# Patient Record
Sex: Male | Born: 1965
Health system: Southern US, Community
[De-identification: ages and names within clinical notes are randomized; demographics above are authoritative.]

## PROBLEM LIST (undated history)

## (undated) DIAGNOSIS — I10 Essential (primary) hypertension: Secondary | ICD-10-CM

## (undated) DIAGNOSIS — C801 Malignant (primary) neoplasm, unspecified: Secondary | ICD-10-CM

---

## 2019-10-28 ENCOUNTER — Telehealth: Payer: Self-pay | Admitting: Physician Assistant

## 2019-10-28 ENCOUNTER — Ambulatory Visit
Admission: RE | Admit: 2019-10-28 | Discharge: 2019-10-28 | Disposition: A | Discharge status: Home / Self Care | Payer: Self-pay | Source: Ambulatory Visit | Attending: Hematology | Admitting: Hematology

## 2019-10-28 ENCOUNTER — Other Ambulatory Visit: Payer: Self-pay

## 2019-10-28 DIAGNOSIS — R160 Hepatomegaly, not elsewhere classified: Secondary | ICD-10-CM

## 2019-10-28 NOTE — Telephone Encounter (Signed)
Received an urgent referral from Dr. Shelia Media from [facility] for colon cancer w/lver mets. Craig Flowers has been scheduled to [facility] and Dr. [name] on 5/21 at 11am. I cld and provided the appt date and time to the pt's sister. Aware to arrive 15 minutes early.

## 2019-10-28 NOTE — Progress Notes (Signed)
Requested CT scan of abdomen from [facility] be pushed to [employer].

## 2019-10-29 ENCOUNTER — Other Ambulatory Visit: Payer: Self-pay

## 2019-10-29 ENCOUNTER — Other Ambulatory Visit: Payer: Self-pay | Admitting: Physician Assistant

## 2019-10-29 ENCOUNTER — Telehealth: Payer: Self-pay | Admitting: General Practice

## 2019-10-29 ENCOUNTER — Inpatient Hospital Stay: Payer: Medicaid Other | Attending: Physician Assistant | Admitting: Physician Assistant

## 2019-10-29 VITALS — BP 95/64 | HR 98 | Temp 97.5°F | Resp 18 | Wt 211.8 lb

## 2019-10-29 DIAGNOSIS — C786 Secondary malignant neoplasm of retroperitoneum and peritoneum: Secondary | ICD-10-CM | POA: Insufficient documentation

## 2019-10-29 DIAGNOSIS — R5383 Other fatigue: Secondary | ICD-10-CM | POA: Diagnosis not present

## 2019-10-29 DIAGNOSIS — R911 Solitary pulmonary nodule: Secondary | ICD-10-CM | POA: Insufficient documentation

## 2019-10-29 DIAGNOSIS — R160 Hepatomegaly, not elsewhere classified: Secondary | ICD-10-CM

## 2019-10-29 DIAGNOSIS — Z79899 Other long term (current) drug therapy: Secondary | ICD-10-CM | POA: Insufficient documentation

## 2019-10-29 DIAGNOSIS — R1031 Right lower quadrant pain: Secondary | ICD-10-CM | POA: Diagnosis not present

## 2019-10-29 DIAGNOSIS — Z7189 Other specified counseling: Secondary | ICD-10-CM | POA: Insufficient documentation

## 2019-10-29 DIAGNOSIS — C787 Secondary malignant neoplasm of liver and intrahepatic bile duct: Secondary | ICD-10-CM | POA: Insufficient documentation

## 2019-10-29 DIAGNOSIS — R1011 Right upper quadrant pain: Secondary | ICD-10-CM | POA: Diagnosis not present

## 2019-10-29 DIAGNOSIS — R110 Nausea: Secondary | ICD-10-CM | POA: Diagnosis not present

## 2019-10-29 DIAGNOSIS — C19 Malignant neoplasm of rectosigmoid junction: Secondary | ICD-10-CM | POA: Insufficient documentation

## 2019-10-29 NOTE — Progress Notes (Signed)
[place] Telephone:([phone]   Fax:([phone]) [phone]  CONSULT NOTE  REFERRING PHYSICIAN: Dr. Shelia Media  REASON FOR CONSULTATION:  Suspicious metastatic colorectal cancer  HPI Craig Flowers is a 54 y.o. male with a past medical history significant for hepatitis C, migraines, hypertension, and osteoarthritis is referred to the clinic for evaluation of suspicious metastatic colorectal cancer.  The patient is accompanied by his sister today.  The patient was referred to the clinic for evaluation after presenting to his primary care provider's office on 10/27/2019 for the chief complaint of 2 months of gradually worsening right sided abdominal pain.  He also saw his primary care provider for worsening cough a few days prior as well who discontinued his lisinopril and started him on losartan.  He had a chest x-ray which was negative for any acute cardiopulmonary disease.  The patient had lab work on 10/27/19 day which demonstrated a CMP with an alkaline phosphatase elevated at 250, AST slightly elevated at 48, and an ALT within normal limits at 33.  The rest of the patient's CMP was unremarkable.  The patient also had GGT testing which was elevated at 130.  The patient's CBC demonstrated mild normocytic anemia with a hemoglobin of 12.9 with the rest of the CBC unremarkable.  The patient also a UA that day which was unremarkable. The patient had a CT scan of the abdomen and pelvis performed that same day which noted mass lesion associated with the right hemicolon with numerous hepatic metastatic lesions and extensive peritoneal and mesenteric metastasis.  There is also a 8 mm left lower lobe pulmonary nodule concerning for a potential metastatic lesion.  The scan also noted bilateral nephrolithiasis without evidence of destruction.  The patient was then referred to our clinic for further evaluation and recommendations regarding his current condition and further work-up.  The patient has never  had a colonoscopy.  He did see Dr. Therisa Doyne in 2019 for treatment of his hepatitis C which he states he no longer has evidence of.  He was scheduled to have a routine colonoscopy next month with Dr. Cristina Gong at gastroenterology.   Today, the patient's main symptom is related to the right-sided abdominal pain which encompasses the right upper quadrant and right lower quadrant.  The patient motions that this pain wraps around to his right lumbar region.  He describes the sensation as a "spasm" or a "sharp pain".  The duration and frequency of the pain varies but it occurs "on and off all day long".  The patient believes that taking a hot shower as well as using mineral ice helps alleviate his pain.  His pain is aggravated by sitting.  He was prescribed tramadol for his pain which helps decrease his pain from a 7- 8 out of 10 to a 4-5 out of 10.  [name] prescribed to take that is much as 6 times a day but he reports that he has been taking it 2-3 times per day.  He denies any recent fever, chills, or night sweats.  He reports approximately a 25 pound weight loss since January 2021.  He reports a decreased appetite.  Denies any significant nausea or vomiting except for vomiting with coughing spells.  He reports that he has had a couple occasions of "not black but really dark stool".  Denies hematochezia, jaundice, abdominal distention, or itching.  He denies any significant diarrhea or constipation but does report that his stools are " long and thin".  His last bowel  movement was yesterday. He denies any palpable lymphadenopathy.  Patient is coughing frequently and clearing his throat but denies any hemoptysis or chest pain.  The patient's family history significant for father who also had colorectal cancer at the age of 63.  His father also has arthritis as well as gout.  The patient's mother has passed away from mesothelioma but she also had migraines, thyroid dysfunction, rosacea, and hypertension.  The patient sister  has osteoporosis and has had multiple fractures and joint replacements.  The patient denies any other known family history of malignancy.  The patient is currently not working and previously worked several "odd jobs".  He lives on 15 acres of property and home by himself; however his sister and father live in the house next door which is also located on this property.  [name] divorced and is currently single.  He does not have any children.  He drinks anywhere between 0-3 drinks per week.  He used to smoke approximately 1 pack of cigarettes over the span of a weekend for about 5 or 6 years but does not smoke at this time.  He does chew tobacco though.  He denies any history of drug use.    Social History Social History   Tobacco Use  . Smoking status: Not on file  Substance Use Topics  . Alcohol use: Not on file  . Drug use: Not on file    No Known Allergies  Current Outpatient Medications  Medication Sig Dispense Refill  . acetaminophen (TYLENOL) 325 MG tablet Take 650 mg by mouth every 6 (six) hours as needed (Take with tramadol.).    Marland Kitchen losartan (COZAAR) 100 MG tablet Take 100 mg by mouth daily.    . Multiple Vitamin (MULTIVITAMIN WITH MINERALS) TABS tablet Take 1 tablet by mouth daily.    . traMADol (ULTRAM) 50 MG tablet Take 50 mg by mouth every 6 (six) hours as needed.    . vitamin C (ASCORBIC ACID) 250 MG tablet Take 500 mg by mouth daily.     No current facility-administered medications for this visit.    Review of Systems REVIEW OF SYSTEMS:   Review of Systems  Constitutional: Positive for appetite change, fatigue, and weight loss.  Negative for fever. HENT: Negative for mouth sores, nosebleeds, sore throat and trouble swallowing.   Eyes: Negative for eye problems and icterus.  Respiratory: Positive for dry cough.  Negative for hemoptysis, shortness of breath and wheezing.   Cardiovascular: Negative for chest pain and leg swelling.  Gastrointestinal: Positive for  right-sided abdominal pain, melena?,  And changes in stool caliber.  Negative for constipation, diarrhea, nausea and vomiting.  Genitourinary: Negative for bladder incontinence, difficulty urinating, dysuria, frequency and hematuria.   Musculoskeletal: Negative for back pain, gait problem, neck pain and neck stiffness.  Skin: Negative for itching and rash.  Neurological: Negative for dizziness, extremity weakness, gait problem, headaches, light-headedness and seizures.  Hematological: Negative for adenopathy. Does not bruise/bleed easily.  Psychiatric/Behavioral: Negative for confusion, depression and sleep disturbance. The patient is not nervous/anxious.     PHYSICAL EXAMINATION:  Blood pressure 95/64, pulse 98, temperature (!) 97.5 F (36.4 C), temperature source Temporal, resp. rate 18, weight 211 lb 12.8 oz (96.1 kg), SpO2 99 %.  ECOG PERFORMANCE STATUS: 1  Physical Exam  Constitutional: Oriented to person, place, and time and well-developed, well-nourished, and in no distress.  HENT:  Head: Normocephalic and atraumatic.  Mouth/Throat: Oropharynx is clear and moist. No oropharyngeal exudate.  Eyes:  Conjunctivae are normal. Right eye exhibits no discharge. Left eye exhibits no discharge. No scleral icterus.  Neck: Normal range of motion. Neck supple.  Cardiovascular: Normal rate, regular rhythm, normal heart sounds and intact distal pulses.   Pulmonary/Chest: Effort normal and breath sounds normal. No respiratory distress. No wheezes. No rales.  Abdominal: Soft. Hepatomegaly. Bowel sounds are normal. Exhibits no distension and no mass. Very mild tenderness over the RLQ.  Musculoskeletal: Normal range of motion. Exhibits no edema.  Lymphadenopathy:    No cervical adenopathy.  Neurological: Alert and oriented to person, place, and time. Exhibits normal muscle tone. Gait normal. Coordination normal.  Skin: Skin is warm and dry. No rash noted. Not diaphoretic. No erythema. No pallor.    Psychiatric: Mood, memory and judgment normal.  Vitals reviewed.  LABORATORY DATA: No results found for: WBC, HGB, HCT, MCV, PLT    Chemistry   No results found for: NA, K, CL, CO2, BUN, CREATININE, GLU No results found for: CALCIUM, ALKPHOS, AST, ALT, BILITOT     RADIOGRAPHIC STUDIES: No results found.  ASSESSMENT:  This is a very pleasant 54 year old male with suspicious metastatic colorectal cancer.  1.  Suspicious stage IV colorectal cancer with liver metastases, peritoneal and mesenteric metastasis. -He was diagnosed in May 2021 after presenting to his primary care provider with a chief complaint of abdominal pain.  His baseline LFTs demonstrated a GGT slightly elevated at 130, alk phos elevated at 250, AST slightly elevated at 48, and AST within normal limits at 33.  Patient has CT scan of the abdomen which demonstrated mass lesion associated with the right hemicolon with numerous hepatic metastatic lesions and extensive peritoneal and mesenteric metastasis.  There is also a 8 mm left lower lobe pulmonary nodule concerning for potential metastatic lesion.   -The patient was seen by Dr. [name] today who had a lengthy discussion with the patient about his current condition and treatment options.  Dr. [name] discussed that this is advanced stage disease and it is not curable but is treatable. -Dr. [name] recommended completing the staging work-up and confirming the diagnosis with tissue diagnosis. -I will arrange for the patient to have a staging CT scan of the chest to further assess for pulmonary metastasis. The CT scan of the abdomen demonstrated a possible 8 mm left lower lobe pulmonary nodule suspicious for metastasis.  -I will arrange for the patient to have an ultrasound-guided biopsy of the liver lesions for tissue diagnosis as well as to be sent for molecular studies with MMR and foundation 1 testing  -I will arrange for follow-up visit with Dr. [name] once I have the results of the  studies so she can have a more detailed discussion with the patient about the recommended treatment and potential side effects.   2. Abdominal pain secondary to #1 -Patient currently prescribed Tramadol for his pain by his PCP  3. Uninsured -Once definitive diagnosis, social work will reach out to the patient to help the patient apply to assistive programs if he qualifies.  -Social work is already aware of the patient.   PLAN: -Liver biopsy of the most accessbile metastatic lesion for tissue diagnosis and molecular studies -CT scan of the chest to complete the staging work up.  -Will arrange follow up with Dr. [name] once I have the results from these studies. I will likely arrange for CEA labs to be done the day of his next follow up visit.   Maxie Slovacek L Leslee Haueter PA-C   Addendum  I  have seen the patient, examined him. I agree with the assessment and and plan and have edited the notes.   Mr. Hightower is a 54 yo male with PMH of hypertension and untreated hepatitis C, presented with worsening right-sided abdominal pain.  His CT AP scan is highly concerning for metastatic colon cancer to liver.  The primary tumor is in the cecum, no radiographic or clinical concern for bowel obstruction, or severe bleeding.  I recommend ultrasound-guided liver biopsy for diagnosis and obtaining tumor tissue for molecular testing.  We discussed the overall prognosis of metastatic colon cancer, and treatment options.  We will get a CT chest to complete staging, plan to see him back in 1 to 2 weeks after this biopsy results return. All questions were answered.    Truitt Merle  10/29/2019

## 2019-10-29 NOTE — Telephone Encounter (Signed)
[place] Initial Psychosocial Assessment Clinical Social Work  Clinical Social Work contacted by phone to assess psychosocial, emotional, mental health, and spiritual needs of the patient.   Barriers to care/review of distress screen:  - Transportation:  Do you anticipate any problems getting to appointments?  Do you have someone who can help run errands for you if you need it?  Sister or father will help w transportation to appointments.  Can afford gas.  Will ask for [facility] if needed.   - Help at home:  What is your living situation (alone, family, other)?  If you are physically unable to care for yourself, who would you call on to help you?  Sister lives on same property and is helping him w all needs.  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  Who would you call on if you needed some kind of practical help?  What if you needed someone to talk to for emotional support?  Mainly family.  Sister is a widow, husband died couple of years ago - she moved her father and brother onto her property for additional support.  Says re patient  "he is my rock."   - Finances:  Are you concerned about finances.  Considering returning to work?  If not, applying for disability?  Was working odd jobs, fairly physical jobs like painting, trim work, Ecologist.  "Had a lot of little jobs to do but I just cant do them."  Advised that when scan/biopsy is complete, he can be assisted to apply for disability and Medicaid.  No income at this point, uninsured also.   What is your understanding of where you are with your cancer? Its cause?  Your treatment plan and what happens next?  Newly diagnosed w colon cancer, is in process of complete work up.  Diagnosis was unexpected.  Patient is unable to work - tried to work, but had to leave most recent job due to severe fatigue.  Sister  is very close to patient - this is very hard on her as well as on the patient.  Patient has an accepting attitude - "will get through this one day at a time."  Sister and family rely on their faith.  CSW Summary:  Patient and family psychosocial functioning including strengths, limitations, and coping skills: 54 yo single male, newly diagnosed with colon cancer, in process of complete work up and development of treatment plan.  Lives on sister's property - she is very supportive, and family is very close.  He has no insurance at this time, nor does he have any income.  He has not had steady income for most of his life - has done odd jobs to support himself.    Identifications of barriers to care:  Lack of insurance and no income.  Availability of community resources:  [employer] for [employer], can be assessed for [employer] by [employer] (message sent).    Clinical Social Worker follow up needed: Yes.    Follow up call in two weeks.  Edwyna Shell, LCSW Clinical Social Worker Phone:  [phone] Cell:  [phone]

## 2019-10-29 NOTE — Patient Instructions (Signed)
-  We will arrange for a CT scan of the chest. Be on the lookout for a phone call from them. If you do not hear anything, their number is [phone]. This is the radiology  -I will arrange for you to have a ultrasound and biopsy of the liver to get a sample of the tissue in the liver. Be on the look out for a phone call from their office. This is the interventional radiology department.

## 2019-10-29 NOTE — Progress Notes (Signed)
Met with patient and his sister Terri at his initial medical oncology appointment today.  I explained by role as nurse navigator and they were given my direct contact number and encouraged to call with any questions or concerns.  They both verbalized an understanding.

## 2019-11-01 ENCOUNTER — Encounter (HOSPITAL_COMMUNITY): Payer: Self-pay

## 2019-11-01 ENCOUNTER — Emergency Department (HOSPITAL_COMMUNITY)
Admission: EM | Admit: 2019-11-01 | Discharge: 2019-11-01 | Disposition: A | Discharge status: Home / Self Care | Payer: Medicaid Other | Attending: Emergency Medicine | Admitting: Emergency Medicine

## 2019-11-01 ENCOUNTER — Other Ambulatory Visit: Payer: Self-pay

## 2019-11-01 ENCOUNTER — Emergency Department (HOSPITAL_COMMUNITY): Payer: Medicaid Other

## 2019-11-01 DIAGNOSIS — Z85038 Personal history of other malignant neoplasm of large intestine: Secondary | ICD-10-CM | POA: Diagnosis not present

## 2019-11-01 DIAGNOSIS — I10 Essential (primary) hypertension: Secondary | ICD-10-CM | POA: Insufficient documentation

## 2019-11-01 DIAGNOSIS — Z79899 Other long term (current) drug therapy: Secondary | ICD-10-CM | POA: Diagnosis not present

## 2019-11-01 DIAGNOSIS — R1031 Right lower quadrant pain: Secondary | ICD-10-CM | POA: Insufficient documentation

## 2019-11-01 DIAGNOSIS — F17220 Nicotine dependence, chewing tobacco, uncomplicated: Secondary | ICD-10-CM | POA: Diagnosis not present

## 2019-11-01 DIAGNOSIS — C787 Secondary malignant neoplasm of liver and intrahepatic bile duct: Secondary | ICD-10-CM | POA: Insufficient documentation

## 2019-11-01 DIAGNOSIS — R109 Unspecified abdominal pain: Secondary | ICD-10-CM

## 2019-11-01 HISTORY — DX: Malignant (primary) neoplasm, unspecified: C80.1

## 2019-11-01 HISTORY — DX: Essential (primary) hypertension: I10

## 2019-11-01 LAB — CBC WITH DIFFERENTIAL/PLATELET
Abs Immature Granulocytes: 0.17 10*3/uL — ABNORMAL HIGH (ref 0.00–0.07)
Basophils Absolute: 0 10*3/uL (ref 0.0–0.1)
Basophils Relative: 0 %
Eosinophils Absolute: 0.2 10*3/uL (ref 0.0–0.5)
Eosinophils Relative: 2 %
HCT: 39.8 % (ref 39.0–52.0)
Hemoglobin: 12.5 g/dL — ABNORMAL LOW (ref 13.0–17.0)
Immature Granulocytes: 2 %
Lymphocytes Relative: 9 %
Lymphs Abs: 0.9 10*3/uL (ref 0.7–4.0)
MCH: 26.9 pg (ref 26.0–34.0)
MCHC: 31.4 g/dL (ref 30.0–36.0)
MCV: 85.8 fL (ref 80.0–100.0)
Monocytes Absolute: 0.7 10*3/uL (ref 0.1–1.0)
Monocytes Relative: 8 %
Neutro Abs: 7.7 10*3/uL (ref 1.7–7.7)
Neutrophils Relative %: 79 %
Platelets: 450 10*3/uL — ABNORMAL HIGH (ref 150–400)
RBC: 4.64 MIL/uL (ref 4.22–5.81)
RDW: 14.1 % (ref 11.5–15.5)
WBC: 9.7 10*3/uL (ref 4.0–10.5)
nRBC: 0 % (ref 0.0–0.2)

## 2019-11-01 LAB — COMPREHENSIVE METABOLIC PANEL
ALT: 45 U/L — ABNORMAL HIGH (ref 0–44)
AST: 95 U/L — ABNORMAL HIGH (ref 15–41)
Albumin: 2.7 g/dL — ABNORMAL LOW (ref 3.5–5.0)
Alkaline Phosphatase: 269 U/L — ABNORMAL HIGH (ref 38–126)
Anion gap: 12 (ref 5–15)
BUN: 19 mg/dL (ref 6–20)
CO2: 25 mmol/L (ref 22–32)
Calcium: 9.1 mg/dL (ref 8.9–10.3)
Chloride: 100 mmol/L (ref 98–111)
Creatinine, Ser: 1.15 mg/dL (ref 0.61–1.24)
GFR calc Af Amer: >60 mL/min (ref >60)
GFR calc non Af Amer: >60 mL/min (ref >60)
Glucose, Bld: 136 mg/dL — ABNORMAL HIGH (ref 70–99)
Potassium: 4.1 mmol/L (ref 3.5–5.1)
Sodium: 137 mmol/L (ref 135–145)
Total Bilirubin: 1.3 mg/dL — ABNORMAL HIGH (ref 0.3–1.2)
Total Protein: 8 g/dL (ref 6.5–8.1)

## 2019-11-01 LAB — URINALYSIS, ROUTINE W REFLEX MICROSCOPIC
Bilirubin Urine: NEGATIVE
Glucose, UA: NEGATIVE mg/dL
Hgb urine dipstick: NEGATIVE
Ketones, ur: NEGATIVE mg/dL
Leukocytes,Ua: NEGATIVE
Nitrite: NEGATIVE
Protein, ur: NEGATIVE mg/dL
Specific Gravity, Urine: 1.011 (ref 1.005–1.030)
pH: 6 (ref 5.0–8.0)

## 2019-11-01 LAB — LIPASE, BLOOD: Lipase: 38 U/L (ref 11–51)

## 2019-11-01 MED ORDER — MORPHINE SULFATE (PF) 4 MG/ML IV SOLN
6.0000 mg | Freq: Once | INTRAVENOUS | Status: AC
  Administered 2019-11-01: 6 mg via INTRAVENOUS
  Filled 2019-11-01: qty 2

## 2019-11-01 MED ORDER — ONDANSETRON 8 MG PO TBDP
8.0000 mg | ORAL_TABLET | Freq: Three times a day (TID) | ORAL | 0 refills | Status: DC | PRN
Start: 2019-11-01 — End: 2019-11-12

## 2019-11-01 MED ORDER — METOCLOPRAMIDE HCL 5 MG/ML IJ SOLN
5.0000 mg | Freq: Once | INTRAMUSCULAR | Status: AC
  Administered 2019-11-01: 5 mg via INTRAVENOUS
  Filled 2019-11-01: qty 2

## 2019-11-01 MED ORDER — SODIUM CHLORIDE 0.9 % IV BOLUS
1000.0000 mL | Freq: Once | INTRAVENOUS | Status: AC
  Administered 2019-11-01: 1000 mL via INTRAVENOUS

## 2019-11-01 MED ORDER — SODIUM CHLORIDE 0.9 % IV SOLN: INTRAVENOUS | Status: DC

## 2019-11-01 MED ORDER — OXYCODONE-ACETAMINOPHEN 5-325 MG PO TABS: 1.0000 | ORAL_TABLET | ORAL | 0 refills | Status: DC | PRN

## 2019-11-01 NOTE — ED Triage Notes (Signed)
Patient reports that he saw his oncologist 3 days ago. Patient states that she prescribed him tramadol. Patient states the Tramadol makes him nauseated and does not help the pain at all. Patient states he called his oncologist and states that he was told to come to the ED for pain control and fluids. Patient states when he has a BM the pain intensifies.

## 2019-11-01 NOTE — ED Provider Notes (Addendum)
[facility] DEPT Provider Note   CSN: [id number] Arrival date & time: 11/01/19  1213     History Chief Complaint  Patient presents with  . cancer patient  . Abdominal Pain    Craig Flowers is a 54 y.o. male.  54 year old male presents with worsening right lower quadrant abdominal discomfort which is now moved to his suprapubic region.  Patient diagnosed with right-sided colon cancer last week.  Had been scheduled to have further testing done for that.  Saw oncology last week and that work-up is progressing.  States that he had a few bouts of emesis this week but none currently.  Denies any fever or chills.  States he is passing flatus.  States the pain comes in waves after he eats or moves his bowels.  He denies any urinary symptoms.  Called his doctor and told to come here.  No treatment for this use prior to arrival        Past Medical History:  Diagnosis Date  . Cancer ([facility] [place])   . Hypertension     Patient Active Problem List   Diagnosis Date Noted  . Metastatic carcinoma to liver ([place]) 10/29/2019  . Goals of care, counseling/discussion 10/29/2019    History reviewed. No pertinent surgical history.     History reviewed. No pertinent family history.  Social History   Tobacco Use  . Smoking status: Never Smoker  . Smokeless tobacco: Current User    Types: Snuff  Substance Use Topics  . Alcohol use: Yes  . Drug use: Never    Home Medications Prior to Admission medications   Medication Sig Start Date End Date Taking? Authorizing Provider  acetaminophen (TYLENOL) 325 MG tablet Take 650 mg by mouth every 6 (six) hours as needed (Take with tramadol.).    [provider]  losartan (COZAAR) 100 MG tablet Take 100 mg by mouth daily.    [provider]  Multiple Vitamin (MULTIVITAMIN WITH MINERALS) TABS tablet Take 1 tablet by mouth daily.    [provider]  traMADol (ULTRAM) 50 MG tablet Take 50 mg by mouth every  6 (six) hours as needed.    [provider]  vitamin C (ASCORBIC ACID) 250 MG tablet Take 500 mg by mouth daily.    [provider]    Allergies    Patient has no known allergies.  Review of Systems   Review of Systems  All other systems reviewed and are negative.   Physical Exam Updated Vital Signs BP 132/71 (BP Location: Right Arm)   Pulse (!) 101   Temp 97.9 F (36.6 C) (Oral)   Resp 18   Ht 1.854 m (6\' 1" )   Wt 95.7 kg   SpO2 97%   BMI 27.84 kg/m   Physical Exam Vitals and nursing note reviewed.  Constitutional:      General: [name] not in acute distress.    Appearance: Normal appearance. [name] well-developed. [name] not toxic-appearing.  HENT:     Head: Normocephalic and atraumatic.  Eyes:     General: Lids are normal.     Conjunctiva/sclera: Conjunctivae normal.     Pupils: Pupils are equal, round, and reactive to light.  Neck:     Thyroid: No thyroid mass.     Trachea: No tracheal deviation.  Cardiovascular:     Rate and Rhythm: Normal rate and regular rhythm.     Heart sounds: Normal heart sounds. No murmur. No gallop.   Pulmonary:  Effort: Pulmonary effort is normal. No respiratory distress.     Breath sounds: Normal breath sounds. No stridor. No decreased breath sounds, wheezing, rhonchi or rales.  Abdominal:     General: Bowel sounds are normal. There is no distension.     Palpations: Abdomen is soft.     Tenderness: There is abdominal tenderness in the right lower quadrant and suprapubic area. There is no guarding or rebound.    Musculoskeletal:        General: No tenderness. Normal range of motion.     Cervical back: Normal range of motion and neck supple.  Skin:    General: Skin is warm and dry.     Findings: No abrasion or rash.  Neurological:     Mental Status: [name] alert and oriented to person, place, and time.     GCS: GCS eye subscore is 4. GCS verbal subscore is 5. GCS motor subscore is 6.     Cranial Nerves: No  cranial nerve deficit.     Sensory: No sensory deficit.  Psychiatric:        Speech: Speech normal.        Behavior: Behavior normal.     ED Results / Procedures / Treatments   Labs (all labs ordered are listed, but only abnormal results are displayed) Labs Reviewed  CBC WITH DIFFERENTIAL/PLATELET  COMPREHENSIVE METABOLIC PANEL  URINALYSIS, ROUTINE W REFLEX MICROSCOPIC  LIPASE, BLOOD    EKG None  Radiology No results found.  Procedures Procedures (including critical care time)  Medications Ordered in ED Medications  sodium chloride 0.9 % bolus 1,000 mL (has no administration in time range)  0.9 %  sodium chloride infusion (has no administration in time range)  metoCLOPramide (REGLAN) injection 5 mg (has no administration in time range)  morphine 4 MG/ML injection 6 mg (has no administration in time range)    ED Course  I have reviewed the triage vital signs and the nursing notes.  Pertinent labs & imaging results that were available during my care of the patient were reviewed by me and considered in my medical decision making (see chart for details).    MDM Rules/Calculators/A&P                      Patient given IV fluids here along with opiate medications and feels much better.  Labs are reviewed here and around his baseline.  Cute abdominal series shows no signs obstruction.  Case discussed with patient as well as his sister.  Patient is reviewed with going home and follow-up tomorrow in the clinic return precautions given.  Will prescribe opiates as well as antiemetics. Final Clinical Impression(s) / ED Diagnoses Final diagnoses:  None    Rx / DC Orders ED Discharge Orders    None       Lacretia Leigh, MD 11/01/19 1626    Lacretia Leigh, MD 11/01/19 1627

## 2019-11-01 NOTE — Progress Notes (Signed)
Patient's sister calls stating patient has definitely declined over the weekend.  Increased abdominal pain and pain after defecation.  Nausea, some episodes of diarrhea.  Coughing up thick mucus.  He was prescribed Tramadol which did not help.  Has not eaten much of anything all weekend.  I consulted with Dr. [name] per her instructions I advised her that patient needs to go to [facility] ED and be admitted to the hospital for dehydration and pain control.   She verbalized an understanding.

## 2019-11-01 NOTE — Progress Notes (Signed)
Craig Flowers Male, 54 y.o., 04-20-1966 MRN:  [id number] Phone:  [phone] Viona Gilmore) PCP:  Deland Pretty, MD Primary Cvg:  None Next Appt With Radiology ([facility]-CT 2) 11/04/2019 at 2:30 PM  RE: Biopsy Received: 2 days ago Message Contents  Craig Daft, MD  Ernestene Mention for US guided liver lesion biopsy.   [place]   Previous Messages  ----- Message -----  From: [name]  Sent: 10/29/2019  5:37 PM EDT  To: [facility] Procedure Requests  Subject: Biopsy                      Procedure Requested:US Core Biopsy (Liver    Reason for Procedure: PLease take multiple core samples to confirm diagnosis and for molecular testing for suspicious metastatic colorectal cancer    Provider Requesting: Heilingoetter, Cassandra Carlean Jews  Provider Telephone: [phone]    Other Info:

## 2019-11-02 ENCOUNTER — Other Ambulatory Visit: Payer: Self-pay

## 2019-11-02 ENCOUNTER — Encounter: Payer: Self-pay | Admitting: Hematology

## 2019-11-02 ENCOUNTER — Inpatient Hospital Stay (HOSPITAL_BASED_OUTPATIENT_CLINIC_OR_DEPARTMENT_OTHER): Payer: Medicaid Other | Admitting: Hematology

## 2019-11-02 VITALS — BP 118/73 | HR 96 | Temp 97.8°F | Resp 17 | Ht 73.0 in | Wt 211.9 lb

## 2019-11-02 DIAGNOSIS — C787 Secondary malignant neoplasm of liver and intrahepatic bile duct: Secondary | ICD-10-CM

## 2019-11-02 DIAGNOSIS — C19 Malignant neoplasm of rectosigmoid junction: Secondary | ICD-10-CM | POA: Diagnosis not present

## 2019-11-02 NOTE — Progress Notes (Signed)
[place]   [phone] [phone]   Clinic Follow up Note   Patient Care Team: Deland Pretty, MD as PCP - General (Internal Medicine) Jonnie Finner, RN as Oncology Nurse Navigator Truitt Merle, MD as Consulting Physician (Hematology) Heilingoetter, Tobe Sos, PA-C as Physician Assistant (Physician Assistant)  Date of Service:  11/02/2019  CHIEF COMPLAINT: Suspicious metastatic colorectal cancer, Abdominal pain    INTERVAL HISTORY:  Craig Flowers is here for a follow up. He presents to the clinic with his father. He went to ED yesterday and feels much better today. He notes in the past 4-5 days he had no appetite and had significant pain. He tried tramadol but that made him sicker and her stopped it. He was bed bound for 3-4 days. He notes his ED visit came him fluids and morphine drip and antiemetics which helped a lot. When he went home he was able to eat very well, no pain and able to sleep. He notes he had a good BM this morning. He notes the BM in the past week were small, clumpy stool with pencil shape. He notes only 1 episode of diarrhea. He was discharged with Percocet and took half tablet 1-2 times.    REVIEW OF SYSTEMS:   Constitutional: Denies fevers, chills or abnormal weight loss Eyes: Denies blurriness of vision Ears, nose, mouth, throat, and face: Denies mucositis or sore throat Respiratory: Denies cough, dyspnea or wheezes Cardiovascular: Denies palpitation, chest discomfort or lower extremity swelling Gastrointestinal:  Denies nausea, heartburn  (+) Improved low stool output (+) Abdomina cramps Skin: Denies abnormal skin rashes Lymphatics: Denies new lymphadenopathy or easy bruising Neurological:Denies numbness, tingling or new weaknesses Behavioral/Psych: Mood is stable, no new changes  All other systems were reviewed with the patient and are negative.  MEDICAL HISTORY:  Past Medical History:  Diagnosis Date  . Cancer ([place])    . Hypertension     SURGICAL HISTORY: History reviewed. No pertinent surgical history.  I have reviewed the social history and family history with the patient and they are unchanged from previous note.  ALLERGIES:  has No Known Allergies.  MEDICATIONS:  Current Outpatient Medications  Medication Sig Dispense Refill  . acetaminophen (TYLENOL) 325 MG tablet Take 650 mg by mouth every 6 (six) hours as needed (Take with tramadol.).    Marland Kitchen losartan (COZAAR) 100 MG tablet Take 100 mg by mouth daily.    . Multiple Vitamin (MULTIVITAMIN WITH MINERALS) TABS tablet Take 1 tablet by mouth daily.    . ondansetron (ZOFRAN ODT) 8 MG disintegrating tablet Take 1 tablet (8 mg total) by mouth every 8 (eight) hours as needed for nausea or vomiting. 20 tablet 0  . oxyCODONE-acetaminophen (PERCOCET/ROXICET) 5-325 MG tablet Take 1-2 tablets by mouth every 4 (four) hours as needed for severe pain. 15 tablet 0  . traMADol (ULTRAM) 50 MG tablet Take 50 mg by mouth every 6 (six) hours as needed.    . vitamin C (ASCORBIC ACID) 250 MG tablet Take 500 mg by mouth daily.     No current facility-administered medications for this visit.    PHYSICAL EXAMINATION: ECOG PERFORMANCE STATUS: 2 - Symptomatic, <50% confined to bed  Vitals:   11/02/19 1029  BP: 118/73  Pulse: 96  Resp: 17  Temp: 97.8 F (36.6 C)  SpO2: 99%   Filed Weights   11/02/19 1029  Weight: 211 lb 14.4 oz (96.1 kg)    Due to COVID19 we will limit examination to appearance.  Patient had no complaints.  GENERAL:alert, no distress and comfortable SKIN: skin color normal, no rashes or significant lesions EYES: normal, Conjunctiva are pink and non-injected, sclera clear  NEURO: alert & oriented x 3 with fluent speech   LABORATORY DATA:  I have reviewed the data as listed CBC Latest Ref Rng & Units 11/01/2019  WBC 4.0 - 10.5 K/uL 9.7  Hemoglobin 13.0 - 17.0 g/dL 12.5(L)  Hematocrit 39.0 - 52.0 % 39.8  Platelets 150 - 400 K/uL 450(H)      CMP Latest Ref Rng & Units 11/01/2019  Glucose 70 - 99 mg/dL 136(H)  BUN 6 - 20 mg/dL 19  Creatinine 0.61 - 1.24 mg/dL 1.15  Sodium 135 - 145 mmol/L 137  Potassium 3.5 - 5.1 mmol/L 4.1  Chloride 98 - 111 mmol/L 100  CO2 22 - 32 mmol/L 25  Calcium 8.9 - 10.3 mg/dL 9.1  Total Protein 6.5 - 8.1 g/dL 8.0  Total Bilirubin 0.3 - 1.2 mg/dL 1.3(H)  Alkaline Phos 38 - 126 U/L 269(H)  AST 15 - 41 U/L 95(H)  ALT 0 - 44 U/L 45(H)      RADIOGRAPHIC STUDIES: I have personally reviewed the radiological images as listed and agreed with the findings in the report. DG ABD ACUTE 2+V W 1V CHEST  Result Date: 11/01/2019 CLINICAL DATA:  Abdominal pain, diarrhea, right lower rib pain EXAM: DG ABDOMEN ACUTE W/ 1V CHEST COMPARISON:  None. FINDINGS: There is no evidence of dilated bowel loops or free intraperitoneal air. Enteric contrast and stool balls in the distal colon and rectum, in keeping with recent prior CT. No radiopaque calculi or other significant radiographic abnormality is seen. Heart size and mediastinal contours are within normal limits. Both lungs are clear. IMPRESSION: 1.  No acute abnormality of the lungs. 2. Nonobstructive pattern of bowel gas. Enteric contrast and stool balls in the distal colon and rectum, in keeping with recent prior CT. Electronically Signed   By: Eddie Candle M.D.   On: 11/01/2019 13:37     ASSESSMENT & PLAN:  Craig Flowers is a 54 y.o. male with   1. Abdominal pain, Nausea and Fatigue  -Due to worsened abdominal pain, low stool output and nausea, he was bed ridden for 3-4 days. He then went to ED on 5/24.   -He did not tolerate Tramadol as it exacerbated his nausea. When he went to ED he was given IV Morphine which helped. He was discharged with percocet 5-322m which is working well. I will refill when he is out of it.  -Today he is doing much better, eating well, sleeping well and had adequate BM this morning. -Now his main pain is abdominal cramping. He has  taken half tablet twice which also helped -I discussed with stronger pain medication this can lead to constipation. I advised him to use prophylactic Miralax to avoid constipation given he already has partial bowel obstruction.  -I discussed with pain control his symptoms she improve.    2.  Suspicious stage IV colorectal cancer with liver metastases, peritoneal and mesenteric metastasis. -He was diagnosed in May 2021 after presenting to his primary care provider with a chief complaint of abdominal pain. His baseline LFTs demonstrated a GGT slightly elevated at 130, alk phos elevated at 250, AST slightly elevated at 48, and AST within normal limits at 33.  His CT AP from 10/28/19 showed mass lesion associated with the right hemicolon with numerous hepatic metastatic lesions and extensive peritoneal and mesenteric metastasis. There is  also a 8 mm left lower lobe pulmonary nodule concerning for potential metastatic lesion.   -I discussed this is appears to be advanced stage disease and it is not curable but is treatable. -I recommended completing the staging work-up and confirming the diagnosis with tissue diagnosis. He will have CT chest on 11/04/19 and Liver biopsy on 11/10/19.  -f/u on 6/4 after biopsy  3. Financial Assistance  -[name] uninsured. Once definitive diagnosis, social work will reach out to the patient to help the patient apply to assistive programs if he qualifies.  -Social work is already aware of the patient.    PLAN: -CT chest on 5/27 scheduled -Liver biopsy on 6/2 scheduled  -Lab and f/u  On 6/4     No problem-specific Assessment & Plan notes found for this encounter.   No orders of the defined types were placed in this encounter.  All questions were answered. The patient knows to call the clinic with any problems, questions or concerns. No barriers to learning was detected. The total time spent in the appointment was 25 minutes.     Truitt Merle, MD 11/02/2019   I, Joslyn Devon, am acting as scribe for Truitt Merle, MD.   I have reviewed the above documentation for accuracy and completeness, and I agree with the above.

## 2019-11-02 NOTE — Progress Notes (Signed)
Patient's sister Karna Christmas calls upset that he is being discharged from the ED despite feeling much better with IVF and pain medication.  I consulted with Dr. [name] and offered patient appointment to come in the morning for evaluation.  He was given an appointment of 1040.  The sister is very emotional over her brother's diagnosis.

## 2019-11-03 ENCOUNTER — Telehealth: Payer: Self-pay | Admitting: Hematology

## 2019-11-03 ENCOUNTER — Encounter: Payer: Self-pay | Admitting: Physician Assistant

## 2019-11-03 NOTE — Telephone Encounter (Signed)
Scheduled appt per 5/25 los.  Spoke with pt and they are aware of their appt date and time 

## 2019-11-04 ENCOUNTER — Ambulatory Visit (HOSPITAL_COMMUNITY)
Admission: RE | Admit: 2019-11-04 | Discharge: 2019-11-04 | Disposition: A | Discharge status: Home / Self Care | Payer: Medicaid Other | Source: Ambulatory Visit | Attending: Physician Assistant | Admitting: Physician Assistant

## 2019-11-04 ENCOUNTER — Other Ambulatory Visit: Payer: Self-pay

## 2019-11-04 DIAGNOSIS — R160 Hepatomegaly, not elsewhere classified: Secondary | ICD-10-CM

## 2019-11-04 MED ORDER — IOHEXOL 300 MG/ML  SOLN
75.0000 mL | Freq: Once | INTRAMUSCULAR | Status: AC | PRN
  Administered 2019-11-04: 75 mL via INTRAVENOUS

## 2019-11-04 MED ORDER — SODIUM CHLORIDE (PF) 0.9 % IJ SOLN
INTRAMUSCULAR | Status: AC
  Filled 2019-11-04: qty 50

## 2019-11-06 ENCOUNTER — Other Ambulatory Visit: Payer: Self-pay | Admitting: Oncology

## 2019-11-06 MED ORDER — OXYCODONE-ACETAMINOPHEN 5-325 MG PO TABS: 1.0000 | ORAL_TABLET | ORAL | 0 refills | Status: DC | PRN

## 2019-11-10 ENCOUNTER — Encounter (HOSPITAL_COMMUNITY): Payer: Self-pay

## 2019-11-10 ENCOUNTER — Other Ambulatory Visit: Payer: Self-pay

## 2019-11-10 ENCOUNTER — Ambulatory Visit (HOSPITAL_COMMUNITY)
Admission: RE | Admit: 2019-11-10 | Discharge: 2019-11-10 | Disposition: A | Discharge status: Home / Self Care | Payer: Medicaid Other | Source: Ambulatory Visit | Attending: Physician Assistant | Admitting: Physician Assistant

## 2019-11-10 DIAGNOSIS — Z79899 Other long term (current) drug therapy: Secondary | ICD-10-CM | POA: Diagnosis not present

## 2019-11-10 DIAGNOSIS — Z79891 Long term (current) use of opiate analgesic: Secondary | ICD-10-CM | POA: Diagnosis not present

## 2019-11-10 DIAGNOSIS — I10 Essential (primary) hypertension: Secondary | ICD-10-CM | POA: Insufficient documentation

## 2019-11-10 DIAGNOSIS — C787 Secondary malignant neoplasm of liver and intrahepatic bile duct: Secondary | ICD-10-CM | POA: Diagnosis not present

## 2019-11-10 DIAGNOSIS — R160 Hepatomegaly, not elsewhere classified: Secondary | ICD-10-CM

## 2019-11-10 DIAGNOSIS — C189 Malignant neoplasm of colon, unspecified: Secondary | ICD-10-CM | POA: Diagnosis present

## 2019-11-10 DIAGNOSIS — J90 Pleural effusion, not elsewhere classified: Secondary | ICD-10-CM | POA: Insufficient documentation

## 2019-11-10 LAB — CBC
HCT: 40.3 % (ref 39.0–52.0)
Hemoglobin: 12.2 g/dL — ABNORMAL LOW (ref 13.0–17.0)
MCH: 26 pg (ref 26.0–34.0)
MCHC: 30.3 g/dL (ref 30.0–36.0)
MCV: 85.7 fL (ref 80.0–100.0)
Platelets: 656 10*3/uL — ABNORMAL HIGH (ref 150–400)
RBC: 4.7 MIL/uL (ref 4.22–5.81)
RDW: 14.5 % (ref 11.5–15.5)
WBC: 16.5 10*3/uL — ABNORMAL HIGH (ref 4.0–10.5)
nRBC: 0 % (ref 0.0–0.2)

## 2019-11-10 LAB — PROTIME-INR
INR: 1.1 (ref 0.8–1.2)
Prothrombin Time: 13.6 seconds (ref 11.4–15.2)

## 2019-11-10 MED ORDER — GELATIN ABSORBABLE 12-7 MM EX MISC
CUTANEOUS | Status: AC
  Filled 2019-11-10: qty 1

## 2019-11-10 MED ORDER — FENTANYL CITRATE (PF) 100 MCG/2ML IJ SOLN
INTRAMUSCULAR | Status: AC | PRN
  Administered 2019-11-10: 50 ug via INTRAVENOUS

## 2019-11-10 MED ORDER — PROMETHAZINE HCL 25 MG/ML IJ SOLN
12.5000 mg | Freq: Four times a day (QID) | INTRAMUSCULAR | Status: DC | PRN
  Administered 2019-11-10: 12.5 mg via INTRAVENOUS
  Filled 2019-11-10 (×3): qty 1

## 2019-11-10 MED ORDER — LIDOCAINE HCL (PF) 1 % IJ SOLN
INTRAMUSCULAR | Status: AC
  Filled 2019-11-10: qty 30

## 2019-11-10 MED ORDER — FENTANYL CITRATE (PF) 100 MCG/2ML IJ SOLN
INTRAMUSCULAR | Status: AC
  Filled 2019-11-10: qty 2

## 2019-11-10 MED ORDER — HYDROCODONE-ACETAMINOPHEN 5-325 MG PO TABS
1.0000 | ORAL_TABLET | ORAL | Status: DC | PRN
  Administered 2019-11-10: 1 via ORAL
  Filled 2019-11-10: qty 1

## 2019-11-10 MED ORDER — MIDAZOLAM HCL 2 MG/2ML IJ SOLN
INTRAMUSCULAR | Status: AC
  Filled 2019-11-10: qty 2

## 2019-11-10 MED ORDER — MIDAZOLAM HCL 2 MG/2ML IJ SOLN
INTRAMUSCULAR | Status: AC | PRN
  Administered 2019-11-10: 1 mg via INTRAVENOUS

## 2019-11-10 NOTE — Procedures (Signed)
  Procedure: US core biopsy  liver lesion   EBL:   minimal Complications:  none immediate  See full dictation in [facility] PACS.  D. Bethanie Bloxom MD Main # [phone] Pager  [phone]    

## 2019-11-10 NOTE — H&P (Signed)
Chief Complaint: Patient was seen in consultation today for liver lesion  Referring Physician(s): Flowers,Craig L  Supervising Physician: Craig Flowers  Patient Status: Craig Flowers - Out-pt  History of Present Illness: Craig Flowers is a 54 y.o. male with past medical history of HTN who presented to his PCP with generalized malaise and abdominal pain 3 weeks ago.  An abdominal CT scan showed concern for metastatic colon cancer.  IR consulted for liver lesion biopsy at the request of Craig Flowers.   Patient presents to IR today in his usual state of health.  He has been NPO.  He reports nausea today which has been present for the past 7-10 days.  He does have anti-nausea medicine at home, however did not take since his was NPO today. [name] aware of procedure plans today and is agreeable to proceed.   Past Medical History:  Diagnosis Date  . Cancer ([place])   . Hypertension     History reviewed. No pertinent surgical history.  Allergies: Patient has no known allergies.  Medications: Prior to Admission medications   Medication Sig Start Date End Date Taking? Authorizing Provider  acetaminophen (TYLENOL) 325 MG tablet Take 650 mg by mouth every 6 (six) hours as needed (Take with tramadol.).    [provider]  losartan (COZAAR) 100 MG tablet Take 100 mg by mouth daily.    [provider]  Multiple Vitamin (MULTIVITAMIN WITH MINERALS) TABS tablet Take 1 tablet by mouth daily.    [provider]  ondansetron (ZOFRAN ODT) 8 MG disintegrating tablet Take 1 tablet (8 mg total) by mouth every 8 (eight) hours as needed for nausea or vomiting. 11/01/19   Craig Leigh, MD  oxyCODONE-acetaminophen (PERCOCET/ROXICET) 5-325 MG tablet Take 1-2 tablets by mouth every 4 (four) hours as needed for severe pain. 11/06/19   Craig Pier, MD  traMADol (ULTRAM) 50 MG tablet Take 50 mg by mouth every 6 (six) hours as needed.    [provider]  vitamin C (ASCORBIC  ACID) 250 MG tablet Take 500 mg by mouth daily.    [provider]     History reviewed. No pertinent family history.  Social History   Socioeconomic History  . Marital status: Single    Spouse name: Not on file  . Number of children: Not on file  . Years of education: Not on file  . Highest education level: Not on file  Occupational History  . Not on file  Tobacco Use  . Smoking status: Never Smoker  . Smokeless tobacco: Current User    Types: Snuff  Substance and Sexual Activity  . Alcohol use: Yes  . Drug use: Never  . Sexual activity: Not on file  Other Topics Concern  . Not on file  Social History Narrative  . Not on file   Social Determinants of Health   Financial Resource Strain:   . Difficulty of Paying Living Expenses:   Food Insecurity:   . Worried About Charity fundraiser in the Last Year:   . Arboriculturist in the Last Year:   Transportation Needs:   . Film/video editor (Medical):   Marland Kitchen Lack of Transportation (Non-Medical):   Physical Activity:   . Days of Exercise per Week:   . Minutes of Exercise per Session:   Stress:   . Feeling of Stress :   Social Connections:   . Frequency of Communication with Friends and Family:   . Frequency of Social Gatherings  with Friends and Family:   . Attends Religious Services:   . Active Member of Clubs or Organizations:   . Attends Archivist Meetings:   Marland Kitchen Marital Status:      Review of Systems: A 12 point ROS discussed and pertinent positives are indicated in the HPI above.  All other systems are negative.  Review of Systems  Constitutional: Negative for fatigue and fever.  Respiratory: Negative for cough and shortness of breath.   Cardiovascular: Negative for chest pain.  Gastrointestinal: Positive for abdominal pain and nausea. Negative for constipation, diarrhea and vomiting.  Musculoskeletal: Negative for back pain.  Psychiatric/Behavioral: Negative for behavioral problems and  confusion.    Vital Signs: BP 125/72   Pulse (!) 103   Temp (!) 97.5 F (36.4 C) (Skin)   Resp 16   Ht 6\' 1"  (1.854 m)   Wt 211 lb (95.7 kg)   SpO2 100%   BMI 27.84 kg/m   Physical Exam Vitals and nursing note reviewed.  Constitutional:      General: [name] not in acute distress.    Appearance: Normal appearance. [name] ill-appearing.  HENT:     Mouth/Throat:     Mouth: Mucous membranes are moist.     Pharynx: Oropharynx is clear.  Cardiovascular:     Rate and Rhythm: Normal rate and regular rhythm.  Pulmonary:     Effort: Pulmonary effort is normal. No respiratory distress.     Breath sounds: Normal breath sounds.  Skin:    General: Skin is warm and dry.  Neurological:     General: No focal deficit present.     Mental Status: [name] alert and oriented to person, place, and time. Mental status is at baseline.  Psychiatric:        Mood and Affect: Mood normal.        Behavior: Behavior normal.        Thought Content: Thought content normal.        Judgment: Judgment normal.      MD Evaluation Airway: WNL Heart: WNL Abdomen: WNL Chest/ Lungs: WNL ASA  Classification: 3 Mallampati/Airway Score: One   Imaging: CT Chest W Contrast  Result Date: 11/04/2019 CLINICAL DATA:  Colorectal cancer, new diagnosis, initial staging EXAM: CT CHEST WITH CONTRAST TECHNIQUE: Multidetector CT imaging of the chest was performed during intravenous contrast administration. CONTRAST:  51mL OMNIPAQUE IOHEXOL 300 MG/ML  SOLN COMPARISON:  None. FINDINGS: Cardiovascular: No significant vascular findings. Normal heart size. No pericardial effusion. Mediastinum/Nodes: No enlarged mediastinal, hilar, or axillary lymph nodes. Thyroid gland, trachea, and esophagus demonstrate no significant findings. Lungs/Pleura: There are multiple bilateral pulmonary nodules, an index nodule of the left lower lobe measuring 1.3 cm (series 5, image 78) and an index nodule of the right lower lobe measuring 1.2 cm  (series 5, image 81). And small right, trace left pleural effusions. Upper Abdomen: Numerous hypodense lesions of the liver. Trace ascites. Musculoskeletal: No chest wall mass or suspicious bone lesions identified. IMPRESSION: 1. Multiple bilateral pulmonary nodules, consistent with pulmonary metastatic disease. 2. Small right, trace left pleural effusions, presumed malignant. 3. Numerous hypodense lesions of the liver, consistent with hepatic metastatic disease. 4. Trace ascites. Electronically Signed   By: Eddie Candle M.D.   On: 11/04/2019 15:38   DG ABD ACUTE 2+V W 1V CHEST  Result Date: 11/01/2019 CLINICAL DATA:  Abdominal pain, diarrhea, right lower rib pain EXAM: DG ABDOMEN ACUTE W/ 1V CHEST COMPARISON:  None. FINDINGS: There is  no evidence of dilated bowel loops or free intraperitoneal air. Enteric contrast and stool balls in the distal colon and rectum, in keeping with recent prior CT. No radiopaque calculi or other significant radiographic abnormality is seen. Heart size and mediastinal contours are within normal limits. Both lungs are clear. IMPRESSION: 1.  No acute abnormality of the lungs. 2. Nonobstructive pattern of bowel gas. Enteric contrast and stool balls in the distal colon and rectum, in keeping with recent prior CT. Electronically Signed   By: Eddie Candle M.D.   On: 11/01/2019 13:37    Labs:  CBC: Recent Labs    11/01/19 1346  WBC 9.7  HGB 12.5*  HCT 39.8  PLT 450*    COAGS: No results for input(s): INR, APTT in the last 8760 hours.  BMP: Recent Labs    11/01/19 1346  NA 137  K 4.1  CL 100  CO2 25  GLUCOSE 136*  BUN 19  CALCIUM 9.1  CREATININE 1.15  GFRNONAA >60  GFRAA >60    LIVER FUNCTION TESTS: Recent Labs    11/01/19 1346  BILITOT 1.3*  AST 95*  ALT 45*  ALKPHOS 269*  PROT 8.0  ALBUMIN 2.7*    TUMOR MARKERS: No results for input(s): AFPTM, CEA, CA199, CHROMGRNA in the last 8760 hours.  Assessment and Plan: Patient with past medical  history of HTN presents with complaint of suspected metastatic disease, liver lesions.  IR consulted for liver lesion biopsy at the request of Craig Flowers. Case reviewed by Dr. Anselm Pancoast who approves patient for procedure.  Patient presents today in their usual state of health.  He has been NPO and is not currently on blood thinners.  Vital signs stable. Reports nausea.  Ordered 12.5mg  IV Phenergan.   Risks and benefits of biopsy was discussed with the patient and/or patient's family including, but not limited to bleeding, infection, damage to adjacent structures or low yield requiring additional tests.  All of the questions were answered and there is agreement to proceed.  Consent signed and in chart.  Thank you for this interesting consult.  I greatly enjoyed meeting Zakhai Jette and look forward to participating in their care.  A copy of this report was sent to the requesting provider on this date.  Electronically Signed: Docia Barrier, PA 11/10/2019, 12:14 PM   I spent a total of  30 Minutes   in face to face in clinical consultation, greater than 50% of which was counseling/coordinating care for liver lesion.

## 2019-11-10 NOTE — Discharge Instructions (Signed)

## 2019-11-11 ENCOUNTER — Other Ambulatory Visit: Payer: Self-pay

## 2019-11-11 DIAGNOSIS — C787 Secondary malignant neoplasm of liver and intrahepatic bile duct: Secondary | ICD-10-CM

## 2019-11-11 NOTE — Progress Notes (Signed)
Craig Flowers   [phone] Fax:([phone]) [phone]   Clinic Follow up Note   Patient Care Team: Deland Pretty, MD as PCP - General (Internal Medicine) Jonnie Finner, RN as Oncology Nurse Navigator Truitt Merle, MD as Consulting Physician (Hematology) Heilingoetter, Tobe Sos, PA-C as Physician Assistant (Physician Assistant)  Date of Service:  11/12/2019  CHIEF COMPLAINT: Metastatic colon cancer  SUMMARY OF ONCOLOGIC HISTORY: Oncology History Overview Note  Cancer Staging No matching staging information was found for the patient.    Metastatic colon cancer to liver ([place])  10/27/2019 Imaging   CT abdomen 10/27/19 at [facility] IMPRESSION: Mass lesion associated with the right hemicolon with numerous hepatic metastatic lesions and extensive peritoneal and mesenteric metastasis. Findings suspicious for primary colonic neoplasm with metastasis.  8 mm left lower lobe pulmonary nodule concerning for potential metastatic lesion.  Bilateral nephrolithiasis without evidence of obstruction.   11/04/2019 Imaging   CT Chest 11/04/19 IMPRESSION: 1. Multiple bilateral pulmonary nodules, consistent with pulmonary metastatic disease. 2. Small right, trace left pleural effusions, presumed malignant. 3. Numerous hypodense lesions of the liver, consistent with hepatic metastatic disease. 4. Trace ascites.   11/10/2019 Initial Biopsy   FINAL MICROSCOPIC DIAGNOSIS: 11/10/19  A. LIVER, RIGHT LOBE, NEEDLE CORE BIOPSY:  - Adenocarcinoma, consistent with colorectal type primary.  - See comment.  COMMENT:  Given the clinical findings, the histology is consistent with the above  diagnosis.  The case was discussed with Dr. [name] on 11/11/2019.  MMR and  Foundation One testing will be performed and the results reported  separately.    11/12/2019 Initial Diagnosis   Metastatic colon cancer to liver ([place])   11/18/2019 -  Chemotherapy   PENDING FOLFOX every 2 weeks starting 11/18/19        CURRENT THERAPY:  PENDING FOLFOX q2weeks starting 11/18/19    INTERVAL HISTORY:  Craig Flowers is here for a follow up. He presents to the clinic with his sister and father. He notes his liver biopsy went well but has pain with deep breath now. [name] taking 2 half tablets percocet a day. He notes the masks makes him cough and does not have a cough without the mask. He had diarrhea after liver biopsy but has resolved today. He denies LE edema. He plans to consult with Dr Marlou Starks on 11/22/19, but they are willing to cancel. His father had colon cancer in 2003 and treated by Dr Truddie Coco without recurrence.  He notes he drinks 3-4 12 ounce bottles of water.    REVIEW OF SYSTEMS:   Constitutional: Denies fevers, chills or abnormal weight loss Eyes: Denies blurriness of vision Ears, nose, mouth, throat, and face: Denies mucositis or sore throat Respiratory: Denies cough, dyspnea or wheezes Cardiovascular: Denies palpitation, chest discomfort or lower extremity swelling Gastrointestinal:  Denies nausea, heartburn or change in bowel habits Skin: Denies abnormal skin rashes Lymphatics: Denies new lymphadenopathy or easy bruising Neurological:Denies numbness, tingling or new weaknesses Behavioral/Psych: Mood is stable, no new changes  All other systems were reviewed with the patient and are negative.  MEDICAL HISTORY:  Past Medical History:  Diagnosis Date  . Cancer ([place])   . Hypertension     SURGICAL HISTORY: History reviewed. No pertinent surgical history.  I have reviewed the social history and family history with the patient and they are unchanged from previous note.  ALLERGIES:  has No Known Allergies.  MEDICATIONS:  Current Outpatient Medications  Medication Sig Dispense Refill  . acetaminophen (TYLENOL) 325 MG tablet  Take 650 mg by mouth every 6 (six) hours as needed (Take with tramadol.).    Marland Kitchen losartan (COZAAR) 100 MG tablet Take 100 mg by mouth daily.    . Multiple Vitamin  (MULTIVITAMIN WITH MINERALS) TABS tablet Take 1 tablet by mouth daily.    . ondansetron (ZOFRAN ODT) 8 MG disintegrating tablet Take 1 tablet (8 mg total) by mouth every 8 (eight) hours as needed for nausea or vomiting. 30 tablet 2  . oxyCODONE (OXY IR/ROXICODONE) 5 MG immediate release tablet Take 1 tablet (5 mg total) by mouth every 6 (six) hours as needed for severe pain. 50 tablet 0  . oxyCODONE-acetaminophen (PERCOCET/ROXICET) 5-325 MG tablet Take 1-2 tablets by mouth every 4 (four) hours as needed for severe pain. 15 tablet 0  . prochlorperazine (COMPAZINE) 10 MG tablet Take 1 tablet (10 mg total) by mouth every 6 (six) hours as needed for nausea or vomiting. 30 tablet 0  . traMADol (ULTRAM) 50 MG tablet Take 50 mg by mouth every 6 (six) hours as needed.    . vitamin C (ASCORBIC ACID) 250 MG tablet Take 500 mg by mouth daily.     No current facility-administered medications for this visit.    PHYSICAL EXAMINATION: ECOG PERFORMANCE STATUS: 2 - Symptomatic, <50% confined to bed  Vitals:   11/12/19 1454  BP: 125/70  Pulse: 94  Resp: 20  Temp: 97.7 F (36.5 C)  SpO2: 100%   Filed Weights   11/12/19 1454  Weight: 205 lb 6.4 oz (93.2 kg)    Due to COVID19 we will limit examination to appearance. Patient had no complaints.  GENERAL:alert, no distress and comfortable SKIN: skin color normal, no rashes or significant lesions EYES: normal, Conjunctiva are pink and non-injected, sclera clear  NEURO: alert & oriented x 3 with fluent speech   LABORATORY DATA:  I have reviewed the data as listed CBC Latest Ref Rng & Units 11/12/2019 11/10/2019 11/01/2019  WBC 4.0 - 10.5 K/uL 12.0(H) 16.5(H) 9.7  Hemoglobin 13.0 - 17.0 g/dL 11.4(L) 12.2(L) 12.5(L)  Hematocrit 39.0 - 52.0 % 37.0(L) 40.3 39.8  Platelets 150 - 400 K/uL 605(H) 656(H) 450(H)     CMP Latest Ref Rng & Units 11/12/2019 11/01/2019  Glucose 70 - 99 mg/dL 126(H) 136(H)  BUN 6 - 20 mg/dL 17 19  Creatinine 0.61 - 1.24 mg/dL 1.37(H)  1.15  Sodium 135 - 145 mmol/L 133(L) 137  Potassium 3.5 - 5.1 mmol/L 4.3 4.1  Chloride 98 - 111 mmol/L 98 100  CO2 22 - 32 mmol/L 23 25  Calcium 8.9 - 10.3 mg/dL 9.6 9.1  Total Protein 6.5 - 8.1 g/dL 7.6 8.0  Total Bilirubin 0.3 - 1.2 mg/dL 0.9 1.3(H)  Alkaline Phos 38 - 126 U/L 522(H) 269(H)  AST 15 - 41 U/L 114(H) 95(H)  ALT 0 - 44 U/L 57(H) 45(H)      RADIOGRAPHIC STUDIES: I have personally reviewed the radiological images as listed and agreed with the findings in the report. No results found.   ASSESSMENT & PLAN:  Craig Flowers is a 54 y.o. male with    1.Stage IV right side colon cancer with liver, lung, peritoneal and mesenteric metastasis. -He was diagnosed in May 2021 after presenting to his primary care provider with a chief complaint of abdominal pain. His baseline LFTs demonstrated a GGT slightly elevated at 130, alk phos elevated at 250, AST slightly elevated at 48,and AST within normal limits at 33. His CT AP from 10/28/19 showed mass lesion  associated with the right hemicolon with numerous hepatic metastatic lesions and extensive peritoneal and mesenteric metastasis.There is also a 21m left lower lobe pulmonary nodule concerning for potential metastatic lesion.  -I discussed his liver biopsy form 11/10/19 show Adenocarcinoma, consistent with colorectal type primary. Given metastatic colon cancer, no colonoscopy is needed. I discussed with stage IV disease, his cancer is no longer curable but still treatable with goal to control his disease and prolong his life.   -I discussed surgery is not recommended at this time unless he develops bowel blockage. I will consult with Dr TMarlou Starks  -I discussed standard treatment with FOLFOX or FOLFIRI every 2 weeks and will add avastin if eligible. The 5FU pump will require PAC placement which he will proceed with next week. I also discussed the option of CAPOX every 3 weeks with oral Xeloda 2 weeks on/1 week off. Due to his PS, I do not think  he is a candidate for FOLFOXFIRI -I recommended FOLFOX and Avastin as first line chemo   --Chemotherapy consent: Side effects including but does not limited to, fatigue, nausea, vomiting, diarrhea, skin toxicity, hair loss, cold sensitivity, neuropathy, fluid retention, renal and kidney dysfunction, neutropenic fever, needed for blood transfusion, bleeding, thrombosis, risk of bowel perforation, proteinuria and hypertension, were discussed with patient in great detail. He agrees to proceed with FOLFOX.  -the goal of therapy is palliative, to prolong his life and improved his quality of life -Plan to monitor his response with scan after 2-3 months treatment and monitor tumor marker. If his cancer is well controlled may consider maintenance therapy with 5FU or Xeloda alone.  -I have requested MMR and Foundation One genomic testing results on his biopsy sample to see if he is eligible for target or immunotherapy. Due to his primary tumor in right colon, I would not recommend EGFR inhibitor as first line therapy even if his KRAS/NRAS/BRAF are wild type  -Labs reviewed, CBC and CMP WNL except WBC 12, hg 11.4, plt 605K, ANC 9.9, BG 126, Cr 1.37, albumin 2.3, AST 114, ALT 57, Alk Phos 522.  -I encouraged him to drink more water.  -f/u with start of chemo  2. Abdominal pain, Nausea and Fatigue  -Due to worsened abdominal pain, low stool output and nausea, he was bed ridden for 3-4 days. He then went to ED on 5/24.   -He did not tolerate Tramadol as it exacerbated his nausea. When he went to ED he was given IV Morphine which helped. He was discharged with percocet 5-3225mwhich is working well. -His symptoms have much improved after pain better controlled. Now his main pain is abdominal cramping. He has taken half tablet Percocet 5-32573mwice daily which has helped.  -I advised him to use prophylactic Miralax to avoid constipation given he already has partial bowel obstruction.  -I will refill his pain  medication with Oxycodone 5mg37mday (11/12/19).    3. Financial Assistance  -[name] uninsured. Once definitive diagnosis, social work will reach out to the patient to help the patient apply to assistive programs if he qualifies.  -Social work is already aware of the patient and is working with him setting up [employer] may be eligible for disability due to stage IV cancer.  -He [facility] Gr[facility] [name] interested.  -If he is Eligible for Avastin, may look for drug replacement for coverage.    PLAN: -[facility]  -Send dietician referral  -PAC placement next week by IR  -Lab, Flush, F/u and FOLFOX and Avastin next week    No problem-specific Assessment & Plan notes found for this encounter.   No orders of the defined types were placed in this encounter.  All questions were answered. The patient knows to call the clinic with any problems, questions or concerns. No barriers to learning was detected. The total time spent in the appointment was 45 minutes.     Truitt Merle, MD 11/12/2019   I, Joslyn Devon, am acting as scribe for Truitt Merle, MD.   I have reviewed the above documentation for accuracy and completeness, and I agree with the above.

## 2019-11-12 ENCOUNTER — Encounter: Payer: Self-pay | Admitting: Hematology

## 2019-11-12 ENCOUNTER — Other Ambulatory Visit: Payer: Self-pay

## 2019-11-12 ENCOUNTER — Inpatient Hospital Stay (HOSPITAL_BASED_OUTPATIENT_CLINIC_OR_DEPARTMENT_OTHER): Payer: Medicaid Other | Admitting: Hematology

## 2019-11-12 ENCOUNTER — Inpatient Hospital Stay: Payer: Medicaid Other

## 2019-11-12 ENCOUNTER — Telehealth: Payer: Self-pay | Admitting: General Practice

## 2019-11-12 ENCOUNTER — Encounter: Payer: Self-pay | Admitting: Licensed Clinical Social Worker

## 2019-11-12 ENCOUNTER — Inpatient Hospital Stay: Payer: Medicaid Other | Attending: Physician Assistant | Admitting: General Practice

## 2019-11-12 ENCOUNTER — Other Ambulatory Visit: Payer: Self-pay | Admitting: Hematology

## 2019-11-12 VITALS — BP 125/70 | HR 94 | Temp 97.7°F | Resp 20 | Ht 73.0 in | Wt 205.4 lb

## 2019-11-12 DIAGNOSIS — R109 Unspecified abdominal pain: Secondary | ICD-10-CM | POA: Insufficient documentation

## 2019-11-12 DIAGNOSIS — R110 Nausea: Secondary | ICD-10-CM | POA: Diagnosis not present

## 2019-11-12 DIAGNOSIS — R5383 Other fatigue: Secondary | ICD-10-CM | POA: Insufficient documentation

## 2019-11-12 DIAGNOSIS — C787 Secondary malignant neoplasm of liver and intrahepatic bile duct: Secondary | ICD-10-CM

## 2019-11-12 DIAGNOSIS — Z79899 Other long term (current) drug therapy: Secondary | ICD-10-CM | POA: Diagnosis not present

## 2019-11-12 DIAGNOSIS — C786 Secondary malignant neoplasm of retroperitoneum and peritoneum: Secondary | ICD-10-CM | POA: Insufficient documentation

## 2019-11-12 DIAGNOSIS — C19 Malignant neoplasm of rectosigmoid junction: Secondary | ICD-10-CM | POA: Insufficient documentation

## 2019-11-12 DIAGNOSIS — C7802 Secondary malignant neoplasm of left lung: Secondary | ICD-10-CM | POA: Insufficient documentation

## 2019-11-12 DIAGNOSIS — C189 Malignant neoplasm of colon, unspecified: Secondary | ICD-10-CM

## 2019-11-12 DIAGNOSIS — Z5111 Encounter for antineoplastic chemotherapy: Secondary | ICD-10-CM | POA: Diagnosis not present

## 2019-11-12 LAB — CBC WITH DIFFERENTIAL (CANCER CENTER ONLY)
Abs Immature Granulocytes: 0.18 10*3/uL — ABNORMAL HIGH (ref 0.00–0.07)
Basophils Absolute: 0.1 10*3/uL (ref 0.0–0.1)
Basophils Relative: 1 %
Eosinophils Absolute: 0.2 10*3/uL (ref 0.0–0.5)
Eosinophils Relative: 2 %
HCT: 37 % — ABNORMAL LOW (ref 39.0–52.0)
Hemoglobin: 11.4 g/dL — ABNORMAL LOW (ref 13.0–17.0)
Immature Granulocytes: 2 %
Lymphocytes Relative: 7 %
Lymphs Abs: 0.8 10*3/uL (ref 0.7–4.0)
MCH: 25.7 pg — ABNORMAL LOW (ref 26.0–34.0)
MCHC: 30.8 g/dL (ref 30.0–36.0)
MCV: 83.5 fL (ref 80.0–100.0)
Monocytes Absolute: 0.9 10*3/uL (ref 0.1–1.0)
Monocytes Relative: 7 %
Neutro Abs: 9.9 10*3/uL — ABNORMAL HIGH (ref 1.7–7.7)
Neutrophils Relative %: 81 %
Platelet Count: 605 10*3/uL — ABNORMAL HIGH (ref 150–400)
RBC: 4.43 MIL/uL (ref 4.22–5.81)
RDW: 14.6 % (ref 11.5–15.5)
WBC Count: 12 10*3/uL — ABNORMAL HIGH (ref 4.0–10.5)
nRBC: 0 % (ref 0.0–0.2)

## 2019-11-12 LAB — CMP (CANCER CENTER ONLY)
ALT: 57 U/L — ABNORMAL HIGH (ref 0–44)
AST: 114 U/L — ABNORMAL HIGH (ref 15–41)
Albumin: 2.3 g/dL — ABNORMAL LOW (ref 3.5–5.0)
Alkaline Phosphatase: 522 U/L — ABNORMAL HIGH (ref 38–126)
Anion gap: 12 (ref 5–15)
BUN: 17 mg/dL (ref 6–20)
CO2: 23 mmol/L (ref 22–32)
Calcium: 9.6 mg/dL (ref 8.9–10.3)
Chloride: 98 mmol/L (ref 98–111)
Creatinine: 1.37 mg/dL — ABNORMAL HIGH (ref 0.61–1.24)
GFR, Est AFR Am: >60 mL/min (ref >60)
GFR, Estimated: 58 mL/min — ABNORMAL LOW (ref >60)
Glucose, Bld: 126 mg/dL — ABNORMAL HIGH (ref 70–99)
Potassium: 4.3 mmol/L (ref 3.5–5.1)
Sodium: 133 mmol/L — ABNORMAL LOW (ref 135–145)
Total Bilirubin: 0.9 mg/dL (ref 0.3–1.2)
Total Protein: 7.6 g/dL (ref 6.5–8.1)

## 2019-11-12 LAB — SURGICAL PATHOLOGY

## 2019-11-12 MED ORDER — ONDANSETRON 8 MG PO TBDP: 8.0000 mg | ORAL_TABLET | Freq: Three times a day (TID) | ORAL | 2 refills | Status: DC | PRN

## 2019-11-12 MED ORDER — OXYCODONE HCL 5 MG PO TABS: 5.0000 mg | ORAL_TABLET | Freq: Four times a day (QID) | ORAL | 0 refills | Status: DC | PRN

## 2019-11-12 MED ORDER — PROCHLORPERAZINE MALEATE 10 MG PO TABS
10.0000 mg | ORAL_TABLET | Freq: Four times a day (QID) | ORAL | 0 refills | Status: DC | PRN
Start: 2019-11-12 — End: 2019-11-13

## 2019-11-12 NOTE — Telephone Encounter (Signed)
[place] CSW Progress Notes  Email from Sales promotion account executive.  She has added this patient to [place] queue for help w Medicaid application, [employer] will refer to [facility] for help w disabliity application.  CSW left VM for sister w this information, also messaged [employer] and asked that they contact both patient and sister for application provess.  Edwyna Shell, LCSW Clinical Social Worker Phone:  [phone] Cell:  [phone]

## 2019-11-12 NOTE — Progress Notes (Signed)
[place] CSW Progress Notes  Call to sister/caregiver to check in.  CSW Wallis Bamberg will provide [employer] card - initial disbursement will be given today.  Spoke w sister re applications for Medicaid and disability - will ask Financial Advocate if referral to [employer] has been made.  Encouraged sister to call me w questions/concerns - she will return to full time work and would be grateful for help w these applications on patients behalf.  She continues to be his primary caregiver and handles all paperwork related to his situation/needs.  Edwyna Shell, LCSW Clinical Social Worker Phone:  [phone] Cell:  [phone]

## 2019-11-12 NOTE — Progress Notes (Signed)
[place] Clinical Social Work  Patient signed up for [employer] and given first of four installments today. No questions at this time.   Edwinna Areola Jovannie Ulibarri, LCSW

## 2019-11-13 ENCOUNTER — Encounter: Payer: Self-pay | Admitting: Hematology

## 2019-11-13 MED ORDER — OXYCODONE HCL 5 MG PO TABS: 5.0000 mg | ORAL_TABLET | Freq: Four times a day (QID) | ORAL | 0 refills | Status: DC | PRN

## 2019-11-13 MED ORDER — PROCHLORPERAZINE MALEATE 10 MG PO TABS: 10.0000 mg | ORAL_TABLET | Freq: Four times a day (QID) | ORAL | 3 refills | Status: AC | PRN

## 2019-11-13 MED ORDER — ONDANSETRON 8 MG PO TBDP: 8.0000 mg | ORAL_TABLET | Freq: Three times a day (TID) | ORAL | 2 refills | Status: DC | PRN

## 2019-11-13 NOTE — Progress Notes (Signed)
START ON PATHWAY REGIMEN - Colorectal     A cycle is every 14 days:     Bevacizumab-xxxx      Oxaliplatin      Leucovorin      Fluorouracil      Fluorouracil   **Always confirm dose/schedule in your pharmacy ordering system**  Patient Characteristics: Distant Metastases, Nonsurgical Candidate, KRAS/NRAS Mutation Positive/Unknown (BRAF V600 Wild-Type/Unknown), Standard Cytotoxic Therapy, First Line Standard Cytotoxic Therapy, Bevacizumab Eligible, PS = 0,1 Tumor Location: Colon Therapeutic Status: Distant Metastases Microsatellite/Mismatch Repair Status: MSS/pMMR BRAF Mutation Status: Awaiting Test Results KRAS/NRAS Mutation Status: Awaiting Test Results Standard Cytotoxic Line of Therapy: First Line Standard Cytotoxic Therapy ECOG Performance Status: 1 Bevacizumab Eligibility: Eligible Intent of Therapy: Non-Curative / Palliative Intent, Discussed with Patient 

## 2019-11-15 ENCOUNTER — Other Ambulatory Visit: Payer: Self-pay

## 2019-11-15 ENCOUNTER — Other Ambulatory Visit: Payer: Self-pay | Admitting: Hematology

## 2019-11-15 ENCOUNTER — Inpatient Hospital Stay: Payer: Medicaid Other

## 2019-11-15 DIAGNOSIS — C787 Secondary malignant neoplasm of liver and intrahepatic bile duct: Secondary | ICD-10-CM

## 2019-11-15 NOTE — Progress Notes (Signed)
Spoke with patient and his sister regarding appointment on 6/14 with Dr. Marcello Moores.  Per Dr. [name] appointment is cancelled no need for surgery at this time.  They both verbalized an understanding.

## 2019-11-16 ENCOUNTER — Other Ambulatory Visit: Payer: Self-pay | Admitting: Radiology

## 2019-11-17 ENCOUNTER — Ambulatory Visit (HOSPITAL_COMMUNITY)
Admission: RE | Admit: 2019-11-17 | Discharge: 2019-11-17 | Disposition: A | Discharge status: Home / Self Care | Payer: Medicaid Other | Source: Ambulatory Visit | Attending: Hematology | Admitting: Hematology

## 2019-11-17 ENCOUNTER — Encounter (HOSPITAL_COMMUNITY): Payer: Self-pay

## 2019-11-17 ENCOUNTER — Other Ambulatory Visit: Payer: Self-pay

## 2019-11-17 DIAGNOSIS — Z79899 Other long term (current) drug therapy: Secondary | ICD-10-CM | POA: Insufficient documentation

## 2019-11-17 DIAGNOSIS — F17290 Nicotine dependence, other tobacco product, uncomplicated: Secondary | ICD-10-CM | POA: Insufficient documentation

## 2019-11-17 DIAGNOSIS — C189 Malignant neoplasm of colon, unspecified: Secondary | ICD-10-CM | POA: Insufficient documentation

## 2019-11-17 DIAGNOSIS — C787 Secondary malignant neoplasm of liver and intrahepatic bile duct: Secondary | ICD-10-CM | POA: Diagnosis present

## 2019-11-17 DIAGNOSIS — I10 Essential (primary) hypertension: Secondary | ICD-10-CM | POA: Diagnosis not present

## 2019-11-17 HISTORY — PX: IR IMAGING GUIDED PORT INSERTION: IMG5740

## 2019-11-17 MED ORDER — CEFAZOLIN SODIUM-DEXTROSE 2-4 GM/100ML-% IV SOLN: 2.0000 g | INTRAVENOUS | Status: AC

## 2019-11-17 MED ORDER — HEPARIN SOD (PORK) LOCK FLUSH 100 UNIT/ML IV SOLN
INTRAVENOUS | Status: AC
  Filled 2019-11-17: qty 5

## 2019-11-17 MED ORDER — HEPARIN SOD (PORK) LOCK FLUSH 100 UNIT/ML IV SOLN
INTRAVENOUS | Status: AC | PRN
  Administered 2019-11-17: 500 [IU] via INTRAVENOUS

## 2019-11-17 MED ORDER — LIDOCAINE-EPINEPHRINE 1 %-1:100000 IJ SOLN
INTRAMUSCULAR | Status: AC
  Filled 2019-11-17: qty 1

## 2019-11-17 MED ORDER — MIDAZOLAM HCL 2 MG/2ML IJ SOLN
INTRAMUSCULAR | Status: AC
  Filled 2019-11-17: qty 4

## 2019-11-17 MED ORDER — FENTANYL CITRATE (PF) 100 MCG/2ML IJ SOLN
INTRAMUSCULAR | Status: AC | PRN
  Administered 2019-11-17 (×2): 50 ug via INTRAVENOUS

## 2019-11-17 MED ORDER — LIDOCAINE-EPINEPHRINE 1 %-1:100000 IJ SOLN
INTRAMUSCULAR | Status: AC | PRN
  Administered 2019-11-17 (×2): 10 mL via INTRADERMAL

## 2019-11-17 MED ORDER — FENTANYL CITRATE (PF) 100 MCG/2ML IJ SOLN
INTRAMUSCULAR | Status: AC
  Filled 2019-11-17: qty 2

## 2019-11-17 MED ORDER — MIDAZOLAM HCL 2 MG/2ML IJ SOLN
INTRAMUSCULAR | Status: AC | PRN
  Administered 2019-11-17 (×4): 1 mg via INTRAVENOUS

## 2019-11-17 MED ORDER — CEFAZOLIN SODIUM-DEXTROSE 2-4 GM/100ML-% IV SOLN
INTRAVENOUS | Status: AC
  Administered 2019-11-17: 2 g via INTRAVENOUS
  Filled 2019-11-17: qty 100

## 2019-11-17 NOTE — Discharge Instructions (Signed)

## 2019-11-17 NOTE — Procedures (Signed)
Interventional Radiology Procedure Note  Procedure: Placement of a LEFT IJ approach single lumen PowerPort.  Tip is positioned at the superior cavoatrial junction and catheter is ready for immediate use.  Complications: No immediate Recommendations:  - Ok to shower tomorrow - Do not submerge for 7 days - Routine line care   Signed,  Kathlen Sakurai K. Tallyn Holroyd, MD   

## 2019-11-17 NOTE — H&P (Signed)
Chief Complaint: Patient was seen in consultation today for port-a-cath placement.   Referring Physician(s): Feng,Yan  Supervising Physician: Jacqulynn Cadet  Patient Status: [facility] - Out-pt  History of Present Illness: Craig Flowers is a 54 y.o. male with a medical history that includes hepatitis C, migraines, hypertension and osteoarthritis. He presented to his PCP 10/24/2019 for gradually worsening abdominal pain of two month's duration. CT imaging of the abdomen/pelvis revealed a mass lesion in the right colon, numerous hepatic metastatic lesions and extensive peritoneal and mesenteric metastasis. An 8 mm left lower lobe pulmonary nodule was also discovered.  A liver lesion biopsy was performed 11/10/2019 by Dr. Vernard Gambles. Pathology was consistent with adenocarcinoma; colorectal type primary.     Interventional Radiology has been asked to evaluate this patient for the placement of a port-a-cath to facilitate his treatment plans.   Past Medical History:  Diagnosis Date  . Cancer ([place])   . Hypertension     No past surgical history on file.  Allergies: Patient has no known allergies.  Medications: Prior to Admission medications   Medication Sig Start Date End Date Taking? Authorizing Provider  acetaminophen (TYLENOL) 325 MG tablet Take 650 mg by mouth every 6 (six) hours as needed (Take with tramadol.).    [provider]  losartan (COZAAR) 100 MG tablet Take 100 mg by mouth daily.    [provider]  Multiple Vitamin (MULTIVITAMIN WITH MINERALS) TABS tablet Take 1 tablet by mouth daily.    [provider]  ondansetron (ZOFRAN ODT) 8 MG disintegrating tablet Take 1 tablet (8 mg total) by mouth every 8 (eight) hours as needed for nausea or vomiting. 11/13/19   Truitt Merle, MD  oxyCODONE (OXY IR/ROXICODONE) 5 MG immediate release tablet Take 1 tablet (5 mg total) by mouth every 6 (six) hours as needed for severe pain. 11/13/19   Truitt Merle, MD    oxyCODONE-acetaminophen (PERCOCET/ROXICET) 5-325 MG tablet Take 1-2 tablets by mouth every 4 (four) hours as needed for severe pain. 11/06/19   Ladell Pier, MD  prochlorperazine (COMPAZINE) 10 MG tablet Take 1 tablet (10 mg total) by mouth every 6 (six) hours as needed for nausea or vomiting. 11/13/19   Truitt Merle, MD  traMADol (ULTRAM) 50 MG tablet Take 50 mg by mouth every 6 (six) hours as needed.    [provider]  vitamin C (ASCORBIC ACID) 250 MG tablet Take 500 mg by mouth daily.    [provider]     No family history on file.  Social History   Socioeconomic History  . Marital status: Single    Spouse name: Not on file  . Number of children: Not on file  . Years of education: Not on file  . Highest education level: Not on file  Occupational History  . Not on file  Tobacco Use  . Smoking status: Never Smoker  . Smokeless tobacco: Current User    Types: Snuff  Substance and Sexual Activity  . Alcohol use: Yes  . Drug use: Never  . Sexual activity: Not on file  Other Topics Concern  . Not on file  Social History Narrative  . Not on file   Social Determinants of Health   Financial Resource Strain:   . Difficulty of Paying Living Expenses:   Food Insecurity:   . Worried About Charity fundraiser in the Last Year:   . Arboriculturist in the Last Year:   Transportation Needs:   . Lack  of Transportation (Medical):   Marland Kitchen Lack of Transportation (Non-Medical):   Physical Activity:   . Days of Exercise per Week:   . Minutes of Exercise per Session:   Stress:   . Feeling of Stress :   Social Connections:   . Frequency of Communication with Friends and Family:   . Frequency of Social Gatherings with Friends and Family:   . Attends Religious Services:   . Active Member of Clubs or Organizations:   . Attends Archivist Meetings:   Marland Kitchen Marital Status:     Review of Systems: A 12 point ROS discussed and pertinent positives are indicated in the  HPI above.  All other systems are negative.  Review of Systems  Constitutional: Positive for appetite change, fatigue and unexpected weight change.  Respiratory: Negative for cough and shortness of breath.   Cardiovascular: Negative for chest pain and leg swelling.  Gastrointestinal: Positive for abdominal distention, abdominal pain and nausea.  Musculoskeletal: Negative for back pain.    Vital Signs: BP 105/64 (BP Location: Right Arm)   Pulse 95   Temp 97.8 F (36.6 C) (Oral)   Resp 18   SpO2 100%   Physical Exam Constitutional:      General: [name] not in acute distress. HENT:     Mouth/Throat:     Mouth: Mucous membranes are moist.  Cardiovascular:     Rate and Rhythm: Normal rate and regular rhythm.     Pulses: Normal pulses.     Heart sounds: Normal heart sounds.  Pulmonary:     Effort: Pulmonary effort is normal.     Breath sounds: Normal breath sounds.  Abdominal:     General: There is distension.     Tenderness: There is abdominal tenderness.     Comments: Lower abdomen, 4/10 pain/pressure  Musculoskeletal:        General: Normal range of motion.  Skin:    General: Skin is warm and dry.  Neurological:     Mental Status: [name] alert and oriented to person, place, and time.  Psychiatric:        Mood and Affect: Mood normal.        Behavior: Behavior normal.        Thought Content: Thought content normal.        Judgment: Judgment normal.     Imaging: CT Chest W Contrast  Result Date: 11/04/2019 CLINICAL DATA:  Colorectal cancer, new diagnosis, initial staging EXAM: CT CHEST WITH CONTRAST TECHNIQUE: Multidetector CT imaging of the chest was performed during intravenous contrast administration. CONTRAST:  7mL OMNIPAQUE IOHEXOL 300 MG/ML  SOLN COMPARISON:  None. FINDINGS: Cardiovascular: No significant vascular findings. Normal heart size. No pericardial effusion. Mediastinum/Nodes: No enlarged mediastinal, hilar, or axillary lymph nodes. Thyroid gland, trachea,  and esophagus demonstrate no significant findings. Lungs/Pleura: There are multiple bilateral pulmonary nodules, an index nodule of the left lower lobe measuring 1.3 cm (series 5, image 78) and an index nodule of the right lower lobe measuring 1.2 cm (series 5, image 81). And small right, trace left pleural effusions. Upper Abdomen: Numerous hypodense lesions of the liver. Trace ascites. Musculoskeletal: No chest wall mass or suspicious bone lesions identified. IMPRESSION: 1. Multiple bilateral pulmonary nodules, consistent with pulmonary metastatic disease. 2. Small right, trace left pleural effusions, presumed malignant. 3. Numerous hypodense lesions of the liver, consistent with hepatic metastatic disease. 4. Trace ascites. Electronically Signed   By: Eddie Candle M.D.   On: 11/04/2019 15:38  DG ABD ACUTE 2+V W 1V CHEST  Result Date: 11/01/2019 CLINICAL DATA:  Abdominal pain, diarrhea, right lower rib pain EXAM: DG ABDOMEN ACUTE W/ 1V CHEST COMPARISON:  None. FINDINGS: There is no evidence of dilated bowel loops or free intraperitoneal air. Enteric contrast and stool balls in the distal colon and rectum, in keeping with recent prior CT. No radiopaque calculi or other significant radiographic abnormality is seen. Heart size and mediastinal contours are within normal limits. Both lungs are clear. IMPRESSION: 1.  No acute abnormality of the lungs. 2. Nonobstructive pattern of bowel gas. Enteric contrast and stool balls in the distal colon and rectum, in keeping with recent prior CT. Electronically Signed   By: Eddie Candle M.D.   On: 11/01/2019 13:37   [place] CORE BIOPSY (LIVER)  Result Date: 11/10/2019 CLINICAL DATA:  Colon mass.  Multiple liver lesions. EXAM: ULTRASOUND-GUIDED CORE LIVER BIOPSY TECHNIQUE: An ultrasound guided liver biopsy was thoroughly discussed with the patient and questions were answered. The benefits, risks, alternatives, and complications were also discussed. The patient understands and  wishes to proceed with the procedure. A verbal as well as written consent was obtained. Survey ultrasound of the liver was performed, representative lesion localized, and an appropriate skin entry site was determined. Skin site was marked, prepped with chlorhexidine, and draped in usual sterile fashion, and infiltrated locally with 1% lidocaine. Intravenous Fentanyl 16mcg and Versed 1mg  were administered as conscious sedation during continuous monitoring of the patient's level of consciousness and physiological / cardiorespiratory status by the radiology RN, with a total moderate sedation time of 20 minutes. A 17 gauge trocar needle was advanced under ultrasound guidance into the liver to the margin of the lesion. 3 coaxial 18gauge core samples were then obtained through the guide needle. The guide needle was removed. Post procedure scans demonstrate no apparent complication. COMPLICATIONS: COMPLICATIONS None immediate FINDINGS: Ill-defined hepatic lesions were localized corresponding to CT findings. Representative core biopsy samples obtained as above. IMPRESSION: 1. Technically successful ultrasound guided core liver lesion biopsy. Electronically Signed   By: Lucrezia Europe M.D.   On: 11/10/2019 15:17    Labs:  CBC: Recent Labs    11/01/19 1346 11/10/19 1233 11/12/19 1401  WBC 9.7 16.5* 12.0*  HGB 12.5* 12.2* 11.4*  HCT 39.8 40.3 37.0*  PLT 450* 656* 605*    COAGS: Recent Labs    11/10/19 1233  INR 1.1    BMP: Recent Labs    11/01/19 1346 11/12/19 1401  NA 137 133*  K 4.1 4.3  CL 100 98  CO2 25 23  GLUCOSE 136* 126*  BUN 19 17  CALCIUM 9.1 9.6  CREATININE 1.15 1.37*  GFRNONAA >60 58*  GFRAA >60 >60    LIVER FUNCTION TESTS: Recent Labs    11/01/19 1346 11/12/19 1401  BILITOT 1.3* 0.9  AST 95* 114*  ALT 45* 57*  ALKPHOS 269* 522*  PROT 8.0 7.6  ALBUMIN 2.7* 2.3*    TUMOR MARKERS: No results for input(s): AFPTM, CEA, CA199, CHROMGRNA in the last 8760  hours.  Assessment and Plan:  Colon cancer with metastasis: Melodye Ped, 54 year old male, presents today to the [facility] department today for the placement of a port-a-cath.   Risks and benefits of image-guided Port-a-catheter placement were discussed with the patient including, but not limited to bleeding, infection, pneumothorax, or fibrin sheath development and need for additional procedures.  All of the patient's questions were answered, patient is agreeable to proceed.  Consent signed  and in chart.  The patient has been NPO, he does not take any blood thinning medications. He will receive pre-procedure prophylactic IV cefazolin.   Thank you for this interesting consult.  I greatly enjoyed meeting Knoxx Boeding and look forward to participating in their care.  A copy of this report was sent to the requesting provider on this date.  Electronically Signed: Theresa Duty, NP 11/17/2019, 12:06 PM   I spent a total of  30 Minutes   in face to face in clinical consultation, greater than 50% of which was counseling/coordinating care for port-a-cath placement.

## 2019-11-18 ENCOUNTER — Other Ambulatory Visit: Payer: Self-pay | Admitting: Hematology

## 2019-11-18 ENCOUNTER — Inpatient Hospital Stay (HOSPITAL_BASED_OUTPATIENT_CLINIC_OR_DEPARTMENT_OTHER): Payer: Medicaid Other | Admitting: Hematology

## 2019-11-18 ENCOUNTER — Encounter: Payer: Self-pay | Admitting: Hematology

## 2019-11-18 ENCOUNTER — Inpatient Hospital Stay: Payer: Medicaid Other

## 2019-11-18 ENCOUNTER — Other Ambulatory Visit: Payer: Self-pay

## 2019-11-18 VITALS — BP 101/54 | HR 89 | Temp 98.6°F | Resp 18

## 2019-11-18 DIAGNOSIS — C189 Malignant neoplasm of colon, unspecified: Secondary | ICD-10-CM

## 2019-11-18 DIAGNOSIS — C787 Secondary malignant neoplasm of liver and intrahepatic bile duct: Secondary | ICD-10-CM

## 2019-11-18 DIAGNOSIS — E860 Dehydration: Secondary | ICD-10-CM

## 2019-11-18 DIAGNOSIS — Z95828 Presence of other vascular implants and grafts: Secondary | ICD-10-CM

## 2019-11-18 DIAGNOSIS — N179 Acute kidney failure, unspecified: Secondary | ICD-10-CM

## 2019-11-18 DIAGNOSIS — Z7189 Other specified counseling: Secondary | ICD-10-CM

## 2019-11-18 DIAGNOSIS — Z5111 Encounter for antineoplastic chemotherapy: Secondary | ICD-10-CM | POA: Diagnosis not present

## 2019-11-18 LAB — CMP (CANCER CENTER ONLY)
ALT: 44 U/L (ref 0–44)
AST: 89 U/L — ABNORMAL HIGH (ref 15–41)
Albumin: 2.4 g/dL — ABNORMAL LOW (ref 3.5–5.0)
Alkaline Phosphatase: 473 U/L — ABNORMAL HIGH (ref 38–126)
Anion gap: 11 (ref 5–15)
BUN: 17 mg/dL (ref 6–20)
CO2: 22 mmol/L (ref 22–32)
Calcium: 9.3 mg/dL (ref 8.9–10.3)
Chloride: 98 mmol/L (ref 98–111)
Creatinine: 1.91 mg/dL — ABNORMAL HIGH (ref 0.61–1.24)
GFR, Est AFR Am: 45 mL/min — ABNORMAL LOW (ref >60)
GFR, Estimated: 39 mL/min — ABNORMAL LOW (ref >60)
Glucose, Bld: 130 mg/dL — ABNORMAL HIGH (ref 70–99)
Potassium: 4.6 mmol/L (ref 3.5–5.1)
Sodium: 131 mmol/L — ABNORMAL LOW (ref 135–145)
Total Bilirubin: 1.2 mg/dL (ref 0.3–1.2)
Total Protein: 7.3 g/dL (ref 6.5–8.1)

## 2019-11-18 LAB — CBC WITH DIFFERENTIAL (CANCER CENTER ONLY)
Abs Immature Granulocytes: 0.25 10*3/uL — ABNORMAL HIGH (ref 0.00–0.07)
Basophils Absolute: 0.1 10*3/uL (ref 0.0–0.1)
Basophils Relative: 1 %
Eosinophils Absolute: 0.2 10*3/uL (ref 0.0–0.5)
Eosinophils Relative: 2 %
HCT: 34.5 % — ABNORMAL LOW (ref 39.0–52.0)
Hemoglobin: 10.8 g/dL — ABNORMAL LOW (ref 13.0–17.0)
Immature Granulocytes: 2 %
Lymphocytes Relative: 6 %
Lymphs Abs: 0.7 10*3/uL (ref 0.7–4.0)
MCH: 26.4 pg (ref 26.0–34.0)
MCHC: 31.3 g/dL (ref 30.0–36.0)
MCV: 84.4 fL (ref 80.0–100.0)
Monocytes Absolute: 1 10*3/uL (ref 0.1–1.0)
Monocytes Relative: 8 %
Neutro Abs: 10.2 10*3/uL — ABNORMAL HIGH (ref 1.7–7.7)
Neutrophils Relative %: 81 %
Platelet Count: 477 10*3/uL — ABNORMAL HIGH (ref 150–400)
RBC: 4.09 MIL/uL — ABNORMAL LOW (ref 4.22–5.81)
RDW: 14.9 % (ref 11.5–15.5)
WBC Count: 12.4 10*3/uL — ABNORMAL HIGH (ref 4.0–10.5)
nRBC: 0 % (ref 0.0–0.2)

## 2019-11-18 MED ORDER — SODIUM CHLORIDE 0.9% FLUSH
10.0000 mL | Freq: Once | INTRAVENOUS | Status: AC
  Administered 2019-11-18: 10 mL
  Filled 2019-11-18: qty 10

## 2019-11-18 MED ORDER — PALONOSETRON HCL INJECTION 0.25 MG/5ML
0.2500 mg | Freq: Once | INTRAVENOUS | Status: AC
  Administered 2019-11-18: 0.25 mg via INTRAVENOUS

## 2019-11-18 MED ORDER — SODIUM CHLORIDE 0.9 % IV SOLN
2400.0000 mg/m2 | INTRAVENOUS | Status: DC
  Administered 2019-11-18: 5250 mg via INTRAVENOUS
  Filled 2019-11-18: qty 105

## 2019-11-18 MED ORDER — SODIUM CHLORIDE 0.9 % IV SOLN
10.0000 mg | Freq: Once | INTRAVENOUS | Status: AC
  Administered 2019-11-18: 10 mg via INTRAVENOUS
  Filled 2019-11-18: qty 10

## 2019-11-18 MED ORDER — LIDOCAINE-PRILOCAINE 2.5-2.5 % EX CREA
1.0000 | TOPICAL_CREAM | CUTANEOUS | 0 refills | Status: DC | PRN
Start: 2019-11-18 — End: 2019-12-10

## 2019-11-18 MED ORDER — OXALIPLATIN CHEMO INJECTION 100 MG/20ML
68.0000 mg/m2 | Freq: Once | INTRAVENOUS | Status: AC
  Administered 2019-11-18: 150 mg via INTRAVENOUS
  Filled 2019-11-18: qty 10

## 2019-11-18 MED ORDER — DEXTROSE 5 % IV SOLN
Freq: Once | INTRAVENOUS | Status: AC
  Filled 2019-11-18: qty 250

## 2019-11-18 MED ORDER — LEUCOVORIN CALCIUM INJECTION 350 MG
400.0000 mg/m2 | Freq: Once | INTRAVENOUS | Status: AC
  Administered 2019-11-18: 876 mg via INTRAVENOUS
  Filled 2019-11-18: qty 43.8

## 2019-11-18 MED ORDER — SODIUM CHLORIDE 0.9 % IV SOLN
Freq: Once | INTRAVENOUS | Status: AC
  Filled 2019-11-18: qty 250

## 2019-11-18 MED ORDER — PALONOSETRON HCL INJECTION 0.25 MG/5ML
INTRAVENOUS | Status: AC
  Filled 2019-11-18: qty 5

## 2019-11-18 NOTE — Patient Instructions (Signed)

## 2019-11-18 NOTE — Patient Instructions (Signed)
[place] Discharge Instructions for Patients Receiving Chemotherapy  Today you received the following chemotherapy agents Oxaliplatin, Leucovorin and Adrucil pump.  To help prevent nausea and vomiting after your treatment, we encourage you to take your nausea medication as directed, BUT NO ZOFRAN FOR 3 DAYS AFTER CHEMO TAKE COMPAZINE INSTEAD.    If you develop nausea and vomiting that is not controlled by your nausea medication, call the clinic.   BELOW ARE SYMPTOMS THAT SHOULD BE REPORTED IMMEDIATELY:  *FEVER GREATER THAN 100.5 F  *CHILLS WITH OR WITHOUT FEVER  NAUSEA AND VOMITING THAT IS NOT CONTROLLED WITH YOUR NAUSEA MEDICATION  *UNUSUAL SHORTNESS OF BREATH  *UNUSUAL BRUISING OR BLEEDING  TENDERNESS IN MOUTH AND THROAT WITH OR WITHOUT PRESENCE OF ULCERS  *URINARY PROBLEMS  *BOWEL PROBLEMS  UNUSUAL RASH Items with * indicate a potential emergency and should be followed up as soon as possible.  Feel free to call the clinic you have any questions or concerns. The clinic phone number is [phone].  Please show the [place] at check-in to the Emergency Department and triage nurse.

## 2019-11-18 NOTE — Progress Notes (Signed)
Craig Flowers   Telephone:([phone] Fax:([phone]) [phone]   Clinic Follow up Note   Patient Care Team: Deland Pretty, MD as PCP - General (Internal Medicine) Jonnie Finner, RN as Oncology Nurse Navigator Truitt Merle, MD as Consulting Physician (Hematology) Heilingoetter, Tobe Sos, PA-C as Physician Assistant (Physician Assistant)  Date of Service:  11/18/2019  CHIEF COMPLAINT: Metastatic colon cancer  SUMMARY OF ONCOLOGIC HISTORY: Oncology History Overview Note  Cancer Staging No matching staging information was found for the patient.    Metastatic colon cancer to liver ([place])  10/27/2019 Imaging   CT abdomen 10/27/19 at [facility] IMPRESSION: Mass lesion associated with the right hemicolon with numerous hepatic metastatic lesions and extensive peritoneal and mesenteric metastasis. Findings suspicious for primary colonic neoplasm with metastasis.  8 mm left lower lobe pulmonary nodule concerning for potential metastatic lesion.  Bilateral nephrolithiasis without evidence of obstruction.   11/04/2019 Imaging   CT Chest 11/04/19 IMPRESSION: 1. Multiple bilateral pulmonary nodules, consistent with pulmonary metastatic disease. 2. Small right, trace left pleural effusions, presumed malignant. 3. Numerous hypodense lesions of the liver, consistent with hepatic metastatic disease. 4. Trace ascites.   11/10/2019 Initial Biopsy   FINAL MICROSCOPIC DIAGNOSIS: 11/10/19  A. LIVER, RIGHT LOBE, NEEDLE CORE BIOPSY:  - Adenocarcinoma, consistent with colorectal type primary.  - See comment.  COMMENT:  Given the clinical findings, the histology is consistent with the above  diagnosis.  The case was discussed with Dr. [name] on 11/11/2019.  MMR and  Foundation One testing will be performed and the results reported  separately.    11/12/2019 Initial Diagnosis   Metastatic colon cancer to liver ([place])   11/18/2019 -  Chemotherapy   FOLFOX q2weeks starting 11/18/19. Avastin to be  added with C2.       CURRENT THERAPY:  FOLFOX q2weeks starting 11/18/19. Avastin to be added with C2.   INTERVAL HISTORY:  Craig Flowers is here for a follow up and treatment. He presents to the clinic alone. He notes his PAC was placed yesterday and went well. He notes he had to fast for his procedure.    REVIEW OF SYSTEMS:   Constitutional: Denies fevers, chills or abnormal weight loss Eyes: Denies blurriness of vision Ears, nose, mouth, throat, and face: Denies mucositis or sore throat Respiratory: Denies cough, dyspnea or wheezes Cardiovascular: Denies palpitation, chest discomfort or lower extremity swelling Gastrointestinal:  Denies nausea, heartburn or change in bowel habits Skin: Denies abnormal skin rashes Lymphatics: Denies new lymphadenopathy or easy bruising Neurological:Denies numbness, tingling or new weaknesses Behavioral/Psych: Mood is stable, no new changes  All other systems were reviewed with the patient and are negative.  MEDICAL HISTORY:  Past Medical History:  Diagnosis Date  . Cancer ([place])   . Hypertension     SURGICAL HISTORY: Past Surgical History:  Procedure Laterality Date  . IR IMAGING GUIDED PORT INSERTION  11/17/2019    I have reviewed the social history and family history with the patient and they are unchanged from previous note.  ALLERGIES:  has No Known Allergies.  MEDICATIONS:  Current Outpatient Medications  Medication Sig Dispense Refill  . acetaminophen (TYLENOL) 325 MG tablet Take 650 mg by mouth every 6 (six) hours as needed (Take with tramadol.).    Marland Kitchen losartan (COZAAR) 100 MG tablet Take 100 mg by mouth daily.    . Multiple Vitamin (MULTIVITAMIN WITH MINERALS) TABS tablet Take 1 tablet by mouth daily.    . ondansetron (ZOFRAN ODT) 8 MG disintegrating tablet  Take 1 tablet (8 mg total) by mouth every 8 (eight) hours as needed for nausea or vomiting. 30 tablet 2  . oxyCODONE (OXY IR/ROXICODONE) 5 MG immediate release tablet Take 1  tablet (5 mg total) by mouth every 6 (six) hours as needed for severe pain. 50 tablet 0  . oxyCODONE-acetaminophen (PERCOCET/ROXICET) 5-325 MG tablet Take 1-2 tablets by mouth every 4 (four) hours as needed for severe pain. 15 tablet 0  . prochlorperazine (COMPAZINE) 10 MG tablet Take 1 tablet (10 mg total) by mouth every 6 (six) hours as needed for nausea or vomiting. 30 tablet 3  . traMADol (ULTRAM) 50 MG tablet Take 50 mg by mouth every 6 (six) hours as needed.    . vitamin C (ASCORBIC ACID) 250 MG tablet Take 500 mg by mouth daily.     No current facility-administered medications for this visit.   Facility-Administered Medications Ordered in Other Visits  Medication Dose Route Frequency Provider Last Rate Last Admin  . fluorouracil (ADRUCIL) 5,250 mg in sodium chloride 0.9 % 145 mL chemo infusion  2,400 mg/m2 (Treatment Plan Recorded) Intravenous 1 day or 1 dose Truitt Merle, MD   5,250 mg at 11/18/19 1425    PHYSICAL EXAMINATION: ECOG PERFORMANCE STATUS: 2 - Symptomatic, <50% confined to bed Blood pressure 101/54, heart rate 89, respirate 18, temperature 37, pulse ox 100% on room air Due to COVID19 we will limit examination to appearance. Patient had no complaints.  GENERAL:alert, no distress and comfortable SKIN: skin color normal, no rashes or significant lesions EYES: normal, Conjunctiva are pink and non-injected, sclera clear  NEURO: alert & oriented x 3 with fluent speech   LABORATORY DATA:  I have reviewed the data as listed CBC Latest Ref Rng & Units 11/18/2019 11/12/2019 11/10/2019  WBC 4.0 - 10.5 K/uL 12.4(H) 12.0(H) 16.5(H)  Hemoglobin 13.0 - 17.0 g/dL 10.8(L) 11.4(L) 12.2(L)  Hematocrit 39 - 52 % 34.5(L) 37.0(L) 40.3  Platelets 150 - 400 K/uL 477(H) 605(H) 656(H)     CMP Latest Ref Rng & Units 11/18/2019 11/12/2019 11/01/2019  Glucose 70 - 99 mg/dL 130(H) 126(H) 136(H)  BUN 6 - 20 mg/dL _0 Creatinine 0.61 - 1.24 mg/dL 1.91(H) 1.37(H) 1.15  Sodium 135 - 145 mmol/L  131(L) 133(L) 137  Potassium 3.5 - 5.1 mmol/L 4.6 4.3 4.1  Chloride 98 - 111 mmol/L 98 98 100  CO2 22 - 32 mmol/L _1 Calcium 8.9 - 10.3 mg/dL 9.3 9.6 9.1  Total Protein 6.5 - 8.1 g/dL 7.3 7.6 8.0  Total Bilirubin 0.3 - 1.2 mg/dL 1.2 0.9 1.3(H)  Alkaline Phos 38 - 126 U/L 473(H) 522(H) 269(H)  AST 15 - 41 U/L 89(H) 114(H) 95(H)  ALT 0 - 44 U/L 44 57(H) 45(H)      RADIOGRAPHIC STUDIES: I have personally reviewed the radiological images as listed and agreed with the findings in the report. IR IMAGING GUIDED PORT INSERTION  Result Date: 11/17/2019 INDICATION: 54 year old male with colonic adenocarcinoma metastatic to the liver. He presents for port catheter placement for durable venous access. EXAM: IMPLANTED PORT A CATH PLACEMENT WITH ULTRASOUND AND FLUOROSCOPIC GUIDANCE MEDICATIONS: 2 g Ancef; The antibiotic was administered within an appropriate time interval prior to skin puncture. ANESTHESIA/SEDATION: Versed 4 mg IV; Fentanyl 100 mcg IV; Moderate Sedation Time:  23 minutes The patient was continuously monitored during the procedure by the interventional radiology nurse under my direct supervision. FLUOROSCOPY TIME:  0 minutes, 12 seconds (2 mGy) COMPLICATIONS: None immediate. PROCEDURE:  The left neck and chest was prepped with chlorhexidine, and draped in the usual sterile fashion using maximum barrier technique (cap and mask, sterile gown, sterile gloves, large sterile sheet, hand hygiene and cutaneous antiseptic). Local anesthesia was attained by infiltration with 1% lidocaine with epinephrine. Ultrasound demonstrated patency of the left internal jugular vein, and this was documented with an image. Under real-time ultrasound guidance, this vein was accessed with a 21 gauge micropuncture needle and image documentation was performed. A small dermatotomy was made at the access site with an 11 scalpel. A 0.018" wire was advanced into the SVC and the access needle exchanged for a 76F  micropuncture vascular sheath. The 0.018" wire was then removed and a 0.035" wire advanced into the IVC. An appropriate location for the subcutaneous reservoir was selected below the clavicle and an incision was made through the skin and underlying soft tissues. The subcutaneous tissues were then dissected using a combination of blunt and sharp surgical technique and a pocket was formed. A single lumen power injectable portacatheter was then tunneled through the subcutaneous tissues from the pocket to the dermatotomy and the port reservoir placed within the subcutaneous pocket. The venous access site was then serially dilated and a peel away vascular sheath placed over the wire. The wire was removed and the port catheter advanced into position under fluoroscopic guidance. The catheter tip is positioned in the upper right atrium. This was documented with a spot image. The portacatheter was then tested and found to flush and aspirate well. The port was flushed with saline followed by 100 units/mL heparinized saline. The pocket was then closed in two layers using first subdermal inverted interrupted absorbable sutures followed by a running subcuticular suture. The epidermis was then sealed with Dermabond. The dermatotomy at the venous access site was also closed with Dermabond. IMPRESSION: Successful placement of a left IJ approach Power Port with ultrasound and fluoroscopic guidance. The catheter is ready for use. Electronically Signed   By: Craig Flowers M.D.   On: 11/17/2019 15:10     ASSESSMENT & PLAN:  Inman Fettig is a 54 y.o. male with    1.Stage IV right side colon cancer with liver, lung, peritoneal and mesenteric metastasis. -He was diagnosed in May 2021 after presenting to his primary care provider with a chief complaint of abdominal pain. His baseline LFTs demonstrated a GGT slightly elevated at 130, alk phos elevated at 250, AST slightly elevated at 48,and AST within normal limits at 33.His  CT AP from 10/28/19 showedmass lesion associated with the right hemicolon with numerous hepatic metastatic lesions and extensive peritoneal and mesenteric metastasis.There is also a 62m left lower lobe pulmonary nodule concerning for potential metastatic lesion.  -His liver biopsy from 11/10/19 showed Adenocarcinoma, consistent with colorectal type primary. Given metastatic colon cancer, no colonoscopy is needed. I discussed with stage IV disease, his cancer is no longer curable but still treatable with goal to control his disease and prolong his life.   -I discussed surgery is not recommended at this time unless he develops bowel blockage. I will consult with Dr TMarlou Starks  -I will start him on first line FOLFOX q2weeks on 11/18/19. He had PAC placed yesterday. Will hold bevacizumab with first cycle chemotherapy due to the new port placement -Plan to monitor his response with scan after 2-3 months treatment and monitor tumor marker. If his cancer is well controlled may consider maintenance therapy with 5FU or Xeloda alone.  -Labs reviewed, CBC and CMP WNL except WBC  12.4, Hg 10/8, plt 477K, ANC 10.2, BG 130, Cr 1.91, AST 89, Alk Phos 473. Overall adequate to proceed with C1 FOLFOX today with dose reduction of oxaliplatin.  His AKI. -Given elevated Cr I will give IV Fluids today and I encouraged him to drink more water.  -f/u next week for toxicity check. I reviewed side effects to watch for and antiemetic use.    2. AKI -Likely prerenal, dehydration from his underlying diffuse metastatic cancer and n.p.o. yesterday for procedure -We will give normal saline 1 L today, I strongly encouraged him to drink more fluids and he agrees. -Follow-up lab next week  3.Financial Assistance -[name] uninsured.Once definitive diagnosis, social work will reach out to the patient to help the patient apply to assistive programs if he qualifies.  -Social work is already aware of the patient and is working with him  setting up [employer] and [employer]. He may be eligible for disability due to stage IV cancer.  -He can use Grants to cover medications through [facility] outpatient pharmacy. [name] interested.  -If he is Eligible for Avastin, may look for drug replacement for coverage.    PLAN: -I refilled EMLA cream at [place] reviewed and adequate to proceed with C1 FOLFOX today with dose reduction of oxaliplatin due to his AKI, no 5-FU bolus, No GCSF -Lab and f/u next week for toxicity checkup -Lab, Flush, F/u and FOLFOX and Avastin in 2 weeks    No problem-specific Assessment & Plan notes found for this encounter.   No orders of the defined types were placed in this encounter.  All questions were answered. The patient knows to call the clinic with any problems, questions or concerns. No barriers to learning was detected. The total time spent in the appointment was 25 minutes.     Truitt Merle, MD 11/18/2019   I, Joslyn Devon, am acting as scribe for Truitt Merle, MD.   I have reviewed the above documentation for accuracy and completeness, and I agree with the above.

## 2019-11-19 ENCOUNTER — Telehealth: Payer: Self-pay | Admitting: *Deleted

## 2019-11-19 ENCOUNTER — Telehealth: Payer: Self-pay | Admitting: Hematology

## 2019-11-19 NOTE — Telephone Encounter (Signed)
Called pt to see how he did with his FOLFOX treatment yest.  He reports doing well & denies any problems.  He knows how to reach [place] with any concerns. He will return tomorrow for pump d/c.

## 2019-11-19 NOTE — Telephone Encounter (Signed)
-----   Message from Herschell Dimes, RN sent at 11/19/2019  9:57 AM EDT ----- Regarding: FIRST TIME - Craig Flowers Craig Flowers's first time Folfox. Melodye Ped - he did great. [phone] Please call today, his treatment was yesterday  Thanks Amy

## 2019-11-19 NOTE — Telephone Encounter (Signed)
Scheduled appt per 6/10 los.  Spoke with pt and he is aware of the appt date and time.

## 2019-11-20 ENCOUNTER — Other Ambulatory Visit: Payer: Self-pay

## 2019-11-20 ENCOUNTER — Inpatient Hospital Stay: Payer: Medicaid Other

## 2019-11-20 VITALS — BP 120/74 | HR 106 | Temp 98.5°F | Resp 18

## 2019-11-20 DIAGNOSIS — Z5111 Encounter for antineoplastic chemotherapy: Secondary | ICD-10-CM | POA: Diagnosis not present

## 2019-11-20 DIAGNOSIS — C189 Malignant neoplasm of colon, unspecified: Secondary | ICD-10-CM

## 2019-11-20 MED ORDER — HEPARIN SOD (PORK) LOCK FLUSH 100 UNIT/ML IV SOLN
500.0000 [IU] | Freq: Once | INTRAVENOUS | Status: AC | PRN
  Administered 2019-11-20: 500 [IU]
  Filled 2019-11-20: qty 5

## 2019-11-20 MED ORDER — SODIUM CHLORIDE 0.9% FLUSH
10.0000 mL | INTRAVENOUS | Status: DC | PRN
  Administered 2019-11-20: 10 mL
  Filled 2019-11-20: qty 10

## 2019-11-22 ENCOUNTER — Inpatient Hospital Stay: Payer: Medicaid Other | Admitting: Nutrition

## 2019-11-22 NOTE — Progress Notes (Signed)
Contacted patient by telephone. Patient is a 54 year old male diagnosed with metastatic colon cancer followed by Dr. [name] receiving FOLFOX every 2 weeks.  Past medical history includes hypertension.  Medications include multivitamin, Zofran, Compazine, and vitamin C.  Labs include sodium 133, glucose 126, and albumin 2.3 on June 4.  Height: 6 feet 1 inch. Weight: 205.4 pounds on June 4. Usual body weight: 211 pounds. BMI: 27.1.  Patient reports he felt well during infusion in the chemotherapy room however he had increased nausea and felt terrible the days after he was placed on continuous infusion at home. He reports increased pain and states his pain medication did not even help. Reports appetite is very poor and he has ongoing nausea. He feels bloated and does not tolerate oral nutrition supplements. Reports his lip was raw. His bowels are moving. He was very tired after his pump was removed.  Nutrition diagnosis: Food and nutrition related knowledge deficit related to metastatic cancer as evidenced by no prior need for nutrition related information.  Intervention: Educated on the importance of smaller more frequent meals and snacks with high-calorie, high-protein foods. Encouraged bites and sips throughout the day.   Recommended a juice-based oral nutrition supplement and provided examples that he could purchase at the grocery store.  If he should choose to drink Ensure and boost, which are both lactose-free, recommended he drink slowly throughout the day. Encourage patient to take nausea medications as recommended by MD and contact provider if these do not eliminate nausea. I will email nutrition fact sheets to sister at patient request.  Monitoring, evaluation, goals: Patient will tolerate increased calories and protein to minimize weight loss.  Next visit: To be scheduled as needed.  **Disclaimer: This note was dictated with voice recognition software. Similar sounding words  can inadvertently be transcribed and this note may contain transcription errors which may not have been corrected upon publication of note.**

## 2019-11-24 NOTE — Progress Notes (Signed)
[facility] OFFICE PROGRESS NOTE  [place] Pretty, [place] [address]  DIAGNOSIS: Metastatic coloncancer  Oncology History Overview Note  Cancer Staging No matching staging information was found for the patient.    Metastatic colon cancer to liver ([place])  10/27/2019 Imaging   CT abdomen 10/27/19 at [facility] IMPRESSION: Mass lesion associated with the right hemicolon with numerous hepatic metastatic lesions and extensive peritoneal and mesenteric metastasis. Findings suspicious for primary colonic neoplasm with metastasis.  8 mm left lower lobe pulmonary nodule concerning for potential metastatic lesion.  Bilateral nephrolithiasis without evidence of obstruction.   11/04/2019 Imaging   CT Chest 11/04/19 IMPRESSION: 1. Multiple bilateral pulmonary nodules, consistent with pulmonary metastatic disease. 2. Small right, trace left pleural effusions, presumed malignant. 3. Numerous hypodense lesions of the liver, consistent with hepatic metastatic disease. 4. Trace ascites.   11/10/2019 Initial Biopsy   FINAL MICROSCOPIC DIAGNOSIS: 11/10/19  A. LIVER, RIGHT LOBE, NEEDLE CORE BIOPSY:  - Adenocarcinoma, consistent with colorectal type primary.  - See comment.  COMMENT:  Given the clinical findings, the histology is consistent with the above  diagnosis.  The case was discussed with Dr. [name] on 11/11/2019.  MMR and  Foundation One testing will be performed and the results reported  separately.    11/12/2019 Initial Diagnosis   Metastatic colon cancer to liver ([place])   11/18/2019 -  Chemotherapy   FOLFOX q2weeks starting 11/18/19. Avastin to be added with C2.      CURRENT THERAPY: FOLFOX q2weeks starting 11/18/19  INTERVAL HISTORY: Craig Flowers 54 y.o. male returns to the clinic today for a follow up visit accompanied by his sister and father. The patient is feeling fair today without any concerning complaints except he endorses discomfort  after his first cycle of treatment. The night after returning home from treatment, he began to experience nausea, abdominal pain not controlled by his pain medication, decreased appetite, and fatigue. These symptoms lasted for 4-5 days. [name] prescribed Compazine and Zofran for nausea. He reports that he alternated these every 8 hours as directed without significant improvement in his nausea. Denies vomiting. He also reports sharp suprapubic pain that was not controlled with his oxycodone the way it had been prior. He has peritoneal involvement of his malignancy. Regarding his appetite, the patient also has met with a member of the nutritionist team, due to his weight loss.  His weight is down 1 lb since last week. He denies any associated constipation or diarrhea.  He denies any fever, chills, or night sweats. He denies any peripheral neuropathy.  He denies any hematochezia, jaundice, or itching. He believes his stool is dark but denies black. The patient is here today for a 1 week follow-up visit and to manage any adverse side effects of treatment.  MEDICAL HISTORY: Past Medical History:  Diagnosis Date  . Cancer ([place])   . Hypertension     ALLERGIES:  has No Known Allergies.  MEDICATIONS:  Current Outpatient Medications  Medication Sig Dispense Refill  . acetaminophen (TYLENOL) 325 MG tablet Take 650 mg by mouth every 6 (six) hours as needed (Take with tramadol.).    Marland Kitchen dexamethasone (DECADRON) 4 MG tablet Take 1 tablet twice a day for 3-4 days following chemotherapy for nausea 40 tablet 2  . DULoxetine (CYMBALTA) 20 MG capsule Take 1 capsule (20 mg total) by mouth daily. 30 capsule 3  . lidocaine-prilocaine (EMLA) cream Apply 1 application topically as needed. 30 g 0  .  losartan (COZAAR) 100 MG tablet Take 100 mg by mouth daily.    . magic mouthwash w/lidocaine SOLN Take 5 mLs by mouth 4 (four) times daily as needed for mouth pain. 240 mL 0  . Multiple Vitamin (MULTIVITAMIN WITH MINERALS) TABS  tablet Take 1 tablet by mouth daily.    . ondansetron (ZOFRAN ODT) 8 MG disintegrating tablet Take 1 tablet (8 mg total) by mouth every 8 (eight) hours as needed for nausea or vomiting. 30 tablet 2  . oxyCODONE (OXY IR/ROXICODONE) 5 MG immediate release tablet Take 1-2 tablets (5-10 mg total) by mouth every 6 (six) hours as needed for severe pain. 90 tablet 0  . oxyCODONE-acetaminophen (PERCOCET/ROXICET) 5-325 MG tablet Take 1-2 tablets by mouth every 4 (four) hours as needed for severe pain. 15 tablet 0  . prochlorperazine (COMPAZINE) 10 MG tablet Take 1 tablet (10 mg total) by mouth every 6 (six) hours as needed for nausea or vomiting. 30 tablet 3  . vitamin C (ASCORBIC ACID) 250 MG tablet Take 500 mg by mouth daily.     No current facility-administered medications for this visit.    SURGICAL HISTORY:  Past Surgical History:  Procedure Laterality Date  . IR IMAGING GUIDED PORT INSERTION  11/17/2019    REVIEW OF SYSTEMS:   Review of Systems  Constitutional: Positive for appetite change, fatigue, and 1 lb weight loss.  Negative for fever. HENT: Positive for mucositis. Negative for nosebleeds, sore throat and trouble swallowing.   Eyes: Negative for eye problems and icterus.  Respiratory: Positive for dry cough.  Negative for hemoptysis, shortness of breath and wheezing.   Cardiovascular: Negative for chest pain and leg swelling.  Gastrointestinal: Positive for right-sided abdominal pain, nausea, and questionable melena. Negative for constipation, diarrhea, and vomiting.  Genitourinary: Negative for bladder incontinence, difficulty urinating, dysuria, frequency and hematuria.   Musculoskeletal: Negative for back pain, gait problem, neck pain and neck stiffness.  Skin: Negative for itching and rash.  Neurological: Negative for dizziness, extremity weakness, gait problem, headaches, light-headedness and seizures.  Hematological: Negative for adenopathy. Does not bruise/bleed easily.   Psychiatric/Behavioral: Negative for confusion, depression and sleep disturbance. The patient is not nervous/anxious.      PHYSICAL EXAMINATION:  Blood pressure 117/77, pulse 98, temperature (!) 97.2 F (36.2 C), temperature source Temporal, resp. rate 17, height 6' 1"  (1.854 m), weight 204 lb 3.2 oz (92.6 kg), SpO2 100 %.  ECOG PERFORMANCE STATUS: 1 - Symptomatic but completely ambulatory  Physical Exam  Constitutional: Oriented to person, place, and time and well-developed, well-nourished, and in no distress.  HENT:  Head: Normocephalic and atraumatic.  Mouth/Throat: Poor dentition Eyes: Conjunctivae are normal. Right eye exhibits no discharge. Left eye exhibits no discharge. No scleral icterus.  Neck: Normal range of motion. Neck supple.  Cardiovascular: Normal rate, regular rhythm, normal heart sounds and intact distal pulses.   Pulmonary/Chest: Effort normal and breath sounds normal. No respiratory distress. No wheezes. No rales.  Abdominal: Firm abdomen. Hepatomegaly. Bowel sounds are normal. Very mild tenderness over the RLQ.  Musculoskeletal: Normal range of motion. Exhibits no edema.  Lymphadenopathy:    No cervical adenopathy.  Neurological: Alert and oriented to person, place, and time. Exhibits normal muscle tone. Gait normal. Coordination normal.  Skin: Skin is warm and dry. No rash noted. Not diaphoretic. No erythema. No pallor.  Psychiatric: Mood, memory and judgment normal.  Vitals reviewed.  LABORATORY DATA: Lab Results  Component Value Date   WBC 12.2 (H) 11/26/2019  HGB 10.6 (L) 11/26/2019   HCT 34.2 (L) 11/26/2019   MCV 84.7 11/26/2019   PLT 456 (H) 11/26/2019      Chemistry      Component Value Date/Time   NA 133 (L) 11/26/2019 1347   K 4.4 11/26/2019 1347   CL 99 11/26/2019 1347   CO2 25 11/26/2019 1347   BUN 13 11/26/2019 1347   CREATININE 0.95 11/26/2019 1347      Component Value Date/Time   CALCIUM 9.0 11/26/2019 1347   ALKPHOS 423 (H)  11/26/2019 1347   AST 71 (H) 11/26/2019 1347   ALT 32 11/26/2019 1347   BILITOT 0.9 11/26/2019 1347       RADIOGRAPHIC STUDIES:  CT Chest W Contrast  Result Date: 11/04/2019 CLINICAL DATA:  Colorectal cancer, new diagnosis, initial staging EXAM: CT CHEST WITH CONTRAST TECHNIQUE: Multidetector CT imaging of the chest was performed during intravenous contrast administration. CONTRAST:  57m OMNIPAQUE IOHEXOL 300 MG/ML  SOLN COMPARISON:  None. FINDINGS: Cardiovascular: No significant vascular findings. Normal heart size. No pericardial effusion. Mediastinum/Nodes: No enlarged mediastinal, hilar, or axillary lymph nodes. Thyroid gland, trachea, and esophagus demonstrate no significant findings. Lungs/Pleura: There are multiple bilateral pulmonary nodules, an index nodule of the left lower lobe measuring 1.3 cm (series 5, image 78) and an index nodule of the right lower lobe measuring 1.2 cm (series 5, image 81). And small right, trace left pleural effusions. Upper Abdomen: Numerous hypodense lesions of the liver. Trace ascites. Musculoskeletal: No chest wall mass or suspicious bone lesions identified. IMPRESSION: 1. Multiple bilateral pulmonary nodules, consistent with pulmonary metastatic disease. 2. Small right, trace left pleural effusions, presumed malignant. 3. Numerous hypodense lesions of the liver, consistent with hepatic metastatic disease. 4. Trace ascites. Electronically Signed   By: AEddie CandleM.D.   On: 11/04/2019 15:38   DG ABD ACUTE 2+V W 1V CHEST  Result Date: 11/01/2019 CLINICAL DATA:  Abdominal pain, diarrhea, right lower rib pain EXAM: DG ABDOMEN ACUTE W/ 1V CHEST COMPARISON:  None. FINDINGS: There is no evidence of dilated bowel loops or free intraperitoneal air. Enteric contrast and stool balls in the distal colon and rectum, in keeping with recent prior CT. No radiopaque calculi or other significant radiographic abnormality is seen. Heart size and mediastinal contours are within  normal limits. Both lungs are clear. IMPRESSION: 1.  No acute abnormality of the lungs. 2. Nonobstructive pattern of bowel gas. Enteric contrast and stool balls in the distal colon and rectum, in keeping with recent prior CT. Electronically Signed   By: AEddie CandleM.D.   On: 11/01/2019 13:37   UK[place]CORE BIOPSY (LIVER)  Result Date: 11/10/2019 CLINICAL DATA:  Colon mass.  Multiple liver lesions. EXAM: ULTRASOUND-GUIDED CORE LIVER BIOPSY TECHNIQUE: An ultrasound guided liver biopsy was thoroughly discussed with the patient and questions were answered. The benefits, risks, alternatives, and complications were also discussed. The patient understands and wishes to proceed with the procedure. A verbal as well as written consent was obtained. Survey ultrasound of the liver was performed, representative lesion localized, and an appropriate skin entry site was determined. Skin site was marked, prepped with chlorhexidine, and draped in usual sterile fashion, and infiltrated locally with 1% lidocaine. Intravenous Fentanyl 572m and Versed 40m57mere administered as conscious sedation during continuous monitoring of the patient's level of consciousness and physiological / cardiorespiratory status by the radiology RN, with a total moderate sedation time of 20 minutes. A 17 gauge trocar needle was advanced under ultrasound guidance into  the liver to the margin of the lesion. 3 coaxial 18gauge core samples were then obtained through the guide needle. The guide needle was removed. Post procedure scans demonstrate no apparent complication. COMPLICATIONS: COMPLICATIONS None immediate FINDINGS: Ill-defined hepatic lesions were localized corresponding to CT findings. Representative core biopsy samples obtained as above. IMPRESSION: 1. Technically successful ultrasound guided core liver lesion biopsy. Electronically Signed   By: Lucrezia Europe M.D.   On: 11/10/2019 15:17   IR IMAGING GUIDED PORT INSERTION  Result Date:  11/17/2019 INDICATION: 54 year old male with colonic adenocarcinoma metastatic to the liver. He presents for port catheter placement for durable venous access. EXAM: IMPLANTED PORT A CATH PLACEMENT WITH ULTRASOUND AND FLUOROSCOPIC GUIDANCE MEDICATIONS: 2 g Ancef; The antibiotic was administered within an appropriate time interval prior to skin puncture. ANESTHESIA/SEDATION: Versed 4 mg IV; Fentanyl 100 mcg IV; Moderate Sedation Time:  23 minutes The patient was continuously monitored during the procedure by the interventional radiology nurse under my direct supervision. FLUOROSCOPY TIME:  0 minutes, 12 seconds (2 mGy) COMPLICATIONS: None immediate. PROCEDURE: The left neck and chest was prepped with chlorhexidine, and draped in the usual sterile fashion using maximum barrier technique (cap and mask, sterile gown, sterile gloves, large sterile sheet, hand hygiene and cutaneous antiseptic). Local anesthesia was attained by infiltration with 1% lidocaine with epinephrine. Ultrasound demonstrated patency of the left internal jugular vein, and this was documented with an image. Under real-time ultrasound guidance, this vein was accessed with a 21 gauge micropuncture needle and image documentation was performed. A small dermatotomy was made at the access site with an 11 scalpel. A 0.018" wire was advanced into the SVC and the access needle exchanged for a 87F micropuncture vascular sheath. The 0.018" wire was then removed and a 0.035" wire advanced into the IVC. An appropriate location for the subcutaneous reservoir was selected below the clavicle and an incision was made through the skin and underlying soft tissues. The subcutaneous tissues were then dissected using a combination of blunt and sharp surgical technique and a pocket was formed. A single lumen power injectable portacatheter was then tunneled through the subcutaneous tissues from the pocket to the dermatotomy and the port reservoir placed within the  subcutaneous pocket. The venous access site was then serially dilated and a peel away vascular sheath placed over the wire. The wire was removed and the port catheter advanced into position under fluoroscopic guidance. The catheter tip is positioned in the upper right atrium. This was documented with a spot image. The portacatheter was then tested and found to flush and aspirate well. The port was flushed with saline followed by 100 units/mL heparinized saline. The pocket was then closed in two layers using first subdermal inverted interrupted absorbable sutures followed by a running subcuticular suture. The epidermis was then sealed with Dermabond. The dermatotomy at the venous access site was also closed with Dermabond. IMPRESSION: Successful placement of a left IJ approach Power Port with ultrasound and fluoroscopic guidance. The catheter is ready for use. Electronically Signed   By: Jacqulynn Cadet M.D.   On: 11/17/2019 15:10     ASSESSMENT/PLAN:  Craig Flowers is a 54 y.o. male with   1.Stage IVright sidecoloncancer with liver, lung,peritoneal and mesenteric metastasis. -He was diagnosed in May 2021 after presenting to his primary care provider with a chief complaint of abdominal pain. His baseline LFTs demonstrated a GGT slightly elevated at 130, alk phos elevated at 250, AST slightly elevated at 48,and AST within normal limits at  33.His CT AP from 10/28/19 showedmass lesion associated with the right hemicolon with numerous hepatic metastatic lesions and extensive peritoneal and mesenteric metastasis.There is also a 48m left lower lobe pulmonary nodule concerning for potential metastatic lesion.  -His liver biopsy from 11/10/19 showedAdenocarcinoma, consistent with colorectal type primary.Given metastatic colon cancer, no colonoscopy is needed. It was discussed with stage IV disease, his cancer is no longer curable but still treatable with goal to control his disease and prolong his life.   -Dr. FBurr Medicopreviously discussed surgery is not recommended at this time unless he develops bowel blockage. She consulted with Dr TMarlou Starks  -Dr. FBurr Medicostarted him on first line FOLFOX q2weeks on 11/18/19. He had PAC placed 11/17/19. Will hold bevacizumab with first cycle chemotherapy due to the new port placement -Plan to monitor his response with scan after 2-3 months treatment and monitor tumor marker. If his cancer is well controlled may consider maintenance therapy with 5FU or Xeloda alone.  -Received reduced dose oxaliplatin with first cycle due to AKI.  -Labs reviewed, CBC and CMP WNL except WBC 12.2, Hg 10.6, plt 456K, ANC 9.8, BG 104, Cr improved at 0.95, AST 71, Alk Phos 423.  -F/U 1 week with cycle #2. Creatinine improved today 11/26/2019. Plan on receiving full dose oxaliplatin with cycle #2 if labs acceptable.   2. Abdominal pain, Nausea and Fatigue, weight loss  -He did not tolerate Tramadol as it exacerbated his nausea. When he went to ED he was given IV Morphine which helped. He was discharged with percocet [id number] is working well. -His symptoms have much improved after pain better controlled. Now his main pain is abdominal cramping. He has taken half tablet Percocet [id number] daily which has helped.  -Experienced worsening abdominal pain following chemotherapy for 4-5 days. Advised that he may take 1-2 tablets every 4-6 hours of 5 mg oxycodone for cancer related pain -Reinforced him use prophylactic Miralax to avoid constipation given he already has partial bowel obstruction.  -I will refill his pain medication with Oxycodone 5mg67mday (11/12/19).  -Dr. FengBurr Medicoo recommended Cymbalta 20 mg for his peritoneal involvement/neuropathic pain.  -Decadron 4 mg BID 3-4 days following chemotherapy sent to patient's pharmacy. Also will hopefully help appetite  3. Mucositis -Following cycle #1 -MMW sent to patient's pharmacy.   3. AKI. Resolved -Likely prerenal, dehydration from his  underlying diffuse metastatic cancer and n.p.o. day prior to appointment for a procedure. Received IVF and dose reduced oxaliplatin for cycle #1 -Patient was instructed to drink plenty of water which he has been doing -Creatinine improved to 0.95 today 11/26/2019 -Follow-up lab next week. Will likely receive full dose oxaliplatin if creatinine acceptable.   4.Financial Assistance -[name] uninsured.Once definitive diagnosis, social work will reach out to the patient to help the patient apply to assistive programs if he qualifies.  -Social work is already aware of the patientand is working with him setting up [employer] may be eligible for disability due to stage IV cancer.  -He [facility] Gr[facility] [name] interested.  -If he is Eligible for Avastin, may look for drug replacement for coverage.   PLAN: -Lab, Flush, F/u and FOLFOXand Avastinin 1 week -Oxycodone 1-2 tablets every 4-6 hours as needed for pain. Refill sent to pharmacy -20 mg p.o. daily of Cymbalta sent to pharmacy -MMW with lidocaine sent to pharmacy for mucositis -Decadron 4 mg BID 3-4 days for nausea and appetite sent to pharmacy -Continue  to follow with nutrition regarding recommendations for weight loss.      No orders of the defined types were placed in this encounter.    Ruthel Martine L Takari Lundahl, PA-C 11/26/19

## 2019-11-26 ENCOUNTER — Other Ambulatory Visit: Payer: Self-pay

## 2019-11-26 ENCOUNTER — Inpatient Hospital Stay: Payer: Medicaid Other

## 2019-11-26 ENCOUNTER — Other Ambulatory Visit: Payer: Self-pay | Admitting: Physician Assistant

## 2019-11-26 ENCOUNTER — Inpatient Hospital Stay (HOSPITAL_BASED_OUTPATIENT_CLINIC_OR_DEPARTMENT_OTHER): Payer: Medicaid Other | Admitting: Physician Assistant

## 2019-11-26 VITALS — BP 117/77 | HR 98 | Temp 97.2°F | Resp 17 | Ht 73.0 in | Wt 204.2 lb

## 2019-11-26 DIAGNOSIS — Z95828 Presence of other vascular implants and grafts: Secondary | ICD-10-CM

## 2019-11-26 DIAGNOSIS — C787 Secondary malignant neoplasm of liver and intrahepatic bile duct: Secondary | ICD-10-CM

## 2019-11-26 DIAGNOSIS — G893 Neoplasm related pain (acute) (chronic): Secondary | ICD-10-CM | POA: Insufficient documentation

## 2019-11-26 DIAGNOSIS — R110 Nausea: Secondary | ICD-10-CM

## 2019-11-26 DIAGNOSIS — R630 Anorexia: Secondary | ICD-10-CM

## 2019-11-26 DIAGNOSIS — K1230 Oral mucositis (ulcerative), unspecified: Secondary | ICD-10-CM

## 2019-11-26 DIAGNOSIS — R112 Nausea with vomiting, unspecified: Secondary | ICD-10-CM | POA: Insufficient documentation

## 2019-11-26 DIAGNOSIS — Z5111 Encounter for antineoplastic chemotherapy: Secondary | ICD-10-CM | POA: Diagnosis not present

## 2019-11-26 DIAGNOSIS — C189 Malignant neoplasm of colon, unspecified: Secondary | ICD-10-CM

## 2019-11-26 LAB — CBC WITH DIFFERENTIAL (CANCER CENTER ONLY)
Abs Immature Granulocytes: 0.22 10*3/uL — ABNORMAL HIGH (ref 0.00–0.07)
Basophils Absolute: 0.1 10*3/uL (ref 0.0–0.1)
Basophils Relative: 1 %
Eosinophils Absolute: 0.3 10*3/uL (ref 0.0–0.5)
Eosinophils Relative: 2 %
HCT: 34.2 % — ABNORMAL LOW (ref 39.0–52.0)
Hemoglobin: 10.6 g/dL — ABNORMAL LOW (ref 13.0–17.0)
Immature Granulocytes: 2 %
Lymphocytes Relative: 7 %
Lymphs Abs: 0.8 10*3/uL (ref 0.7–4.0)
MCH: 26.2 pg (ref 26.0–34.0)
MCHC: 31 g/dL (ref 30.0–36.0)
MCV: 84.7 fL (ref 80.0–100.0)
Monocytes Absolute: 1 10*3/uL (ref 0.1–1.0)
Monocytes Relative: 8 %
Neutro Abs: 9.8 10*3/uL — ABNORMAL HIGH (ref 1.7–7.7)
Neutrophils Relative %: 80 %
Platelet Count: 456 10*3/uL — ABNORMAL HIGH (ref 150–400)
RBC: 4.04 MIL/uL — ABNORMAL LOW (ref 4.22–5.81)
RDW: 15.9 % — ABNORMAL HIGH (ref 11.5–15.5)
WBC Count: 12.2 10*3/uL — ABNORMAL HIGH (ref 4.0–10.5)
nRBC: 0 % (ref 0.0–0.2)

## 2019-11-26 LAB — CMP (CANCER CENTER ONLY)
ALT: 32 U/L (ref 0–44)
AST: 71 U/L — ABNORMAL HIGH (ref 15–41)
Albumin: 2.2 g/dL — ABNORMAL LOW (ref 3.5–5.0)
Alkaline Phosphatase: 423 U/L — ABNORMAL HIGH (ref 38–126)
Anion gap: 9 (ref 5–15)
BUN: 13 mg/dL (ref 6–20)
CO2: 25 mmol/L (ref 22–32)
Calcium: 9 mg/dL (ref 8.9–10.3)
Chloride: 99 mmol/L (ref 98–111)
Creatinine: 0.95 mg/dL (ref 0.61–1.24)
GFR, Est AFR Am: >60 mL/min (ref >60)
GFR, Estimated: >60 mL/min (ref >60)
Glucose, Bld: 104 mg/dL — ABNORMAL HIGH (ref 70–99)
Potassium: 4.4 mmol/L (ref 3.5–5.1)
Sodium: 133 mmol/L — ABNORMAL LOW (ref 135–145)
Total Bilirubin: 0.9 mg/dL (ref 0.3–1.2)
Total Protein: 6.7 g/dL (ref 6.5–8.1)

## 2019-11-26 MED ORDER — DEXAMETHASONE 4 MG PO TABS: ORAL_TABLET | ORAL | 2 refills | Status: AC

## 2019-11-26 MED ORDER — OXYCODONE HCL 5 MG PO TABS: 5.0000 mg | ORAL_TABLET | Freq: Four times a day (QID) | ORAL | 0 refills | Status: DC | PRN

## 2019-11-26 MED ORDER — MAGIC MOUTHWASH W/LIDOCAINE: 5.0000 mL | Freq: Four times a day (QID) | ORAL | 0 refills | Status: AC | PRN

## 2019-11-26 MED ORDER — HEPARIN SOD (PORK) LOCK FLUSH 100 UNIT/ML IV SOLN
500.0000 [IU] | Freq: Once | INTRAVENOUS | Status: AC
  Administered 2019-11-26: 500 [IU] via INTRAVENOUS
  Filled 2019-11-26: qty 5

## 2019-11-26 MED ORDER — DULOXETINE HCL 20 MG PO CPEP: 20.0000 mg | ORAL_CAPSULE | Freq: Every day | ORAL | 3 refills | Status: AC

## 2019-11-26 MED ORDER — SODIUM CHLORIDE 0.9% FLUSH
10.0000 mL | INTRAVENOUS | Status: DC | PRN
  Administered 2019-11-26: 10 mL via INTRAVENOUS
  Filled 2019-11-26: qty 10

## 2019-11-26 MED FILL — DULOXETINE HCL 20 MG CPEP: 20 | 30 days supply | Qty: 30 | Fill #0

## 2019-11-26 MED FILL — DEXAMETHASONE 4 MG TABLET: 4 | 30 days supply | Qty: 30 | Fill #0

## 2019-11-26 MED FILL — oxyCODONE HCL 5 MG TABS: 5 | 11 days supply | Qty: 90 | Fill #0

## 2019-11-26 MED FILL — MAGIC MOUTHWASH D/M/L: 12 days supply | Qty: 240 | Fill #0

## 2019-11-29 ENCOUNTER — Telehealth: Payer: Self-pay | Admitting: Physician Assistant

## 2019-11-29 ENCOUNTER — Encounter (HOSPITAL_COMMUNITY): Payer: Self-pay | Admitting: Hematology

## 2019-11-29 NOTE — Telephone Encounter (Signed)
Scheduled per 6/18 los. Infusion Is booked week of 24th, could only do tx starting 6/28. Cassie is aware. Pt is aware appts on 6/28.

## 2019-12-03 ENCOUNTER — Ambulatory Visit: Payer: Self-pay

## 2019-12-03 MED FILL — Dexamethasone Sodium Phosphate Inj 100 MG/10ML: INTRAMUSCULAR | Qty: 1 | Status: AC

## 2019-12-06 ENCOUNTER — Other Ambulatory Visit: Payer: Self-pay

## 2019-12-06 ENCOUNTER — Inpatient Hospital Stay: Payer: Medicaid Other

## 2019-12-06 ENCOUNTER — Inpatient Hospital Stay: Admit: 2019-12-06 | Payer: Self-pay | Admitting: Internal Medicine

## 2019-12-06 ENCOUNTER — Telehealth: Payer: Self-pay | Admitting: *Deleted

## 2019-12-06 ENCOUNTER — Emergency Department (HOSPITAL_COMMUNITY): Payer: Medicaid Other

## 2019-12-06 ENCOUNTER — Inpatient Hospital Stay (HOSPITAL_BASED_OUTPATIENT_CLINIC_OR_DEPARTMENT_OTHER): Payer: Medicaid Other | Admitting: Physician Assistant

## 2019-12-06 ENCOUNTER — Inpatient Hospital Stay (HOSPITAL_COMMUNITY)
Admission: EM | Admit: 2019-12-06 | Discharge: 2019-12-10 | DRG: 682 | Disposition: A | Discharge status: Hospice | Payer: Medicaid Other | Source: Ambulatory Visit | Attending: Internal Medicine | Admitting: Internal Medicine

## 2019-12-06 ENCOUNTER — Encounter: Payer: Self-pay | Admitting: Physician Assistant

## 2019-12-06 ENCOUNTER — Encounter (HOSPITAL_COMMUNITY): Payer: Self-pay | Admitting: Emergency Medicine

## 2019-12-06 VITALS — BP 94/60 | HR 104 | Temp 97.3°F | Resp 18 | Ht 73.0 in | Wt 204.8 lb

## 2019-12-06 DIAGNOSIS — G9341 Metabolic encephalopathy: Secondary | ICD-10-CM | POA: Diagnosis present

## 2019-12-06 DIAGNOSIS — R5383 Other fatigue: Secondary | ICD-10-CM | POA: Diagnosis not present

## 2019-12-06 DIAGNOSIS — Z66 Do not resuscitate: Secondary | ICD-10-CM | POA: Diagnosis present

## 2019-12-06 DIAGNOSIS — I959 Hypotension, unspecified: Secondary | ICD-10-CM | POA: Diagnosis present

## 2019-12-06 DIAGNOSIS — E871 Hypo-osmolality and hyponatremia: Secondary | ICD-10-CM | POA: Diagnosis present

## 2019-12-06 DIAGNOSIS — D649 Anemia, unspecified: Secondary | ICD-10-CM | POA: Diagnosis present

## 2019-12-06 DIAGNOSIS — Z515 Encounter for palliative care: Secondary | ICD-10-CM | POA: Diagnosis present

## 2019-12-06 DIAGNOSIS — N179 Acute kidney failure, unspecified: Secondary | ICD-10-CM

## 2019-12-06 DIAGNOSIS — C19 Malignant neoplasm of rectosigmoid junction: Secondary | ICD-10-CM | POA: Diagnosis present

## 2019-12-06 DIAGNOSIS — C189 Malignant neoplasm of colon, unspecified: Secondary | ICD-10-CM | POA: Diagnosis present

## 2019-12-06 DIAGNOSIS — C7802 Secondary malignant neoplasm of left lung: Secondary | ICD-10-CM | POA: Diagnosis not present

## 2019-12-06 DIAGNOSIS — E860 Dehydration: Secondary | ICD-10-CM

## 2019-12-06 DIAGNOSIS — I10 Essential (primary) hypertension: Secondary | ICD-10-CM | POA: Diagnosis present

## 2019-12-06 DIAGNOSIS — C7972 Secondary malignant neoplasm of left adrenal gland: Secondary | ICD-10-CM | POA: Diagnosis present

## 2019-12-06 DIAGNOSIS — C801 Malignant (primary) neoplasm, unspecified: Secondary | ICD-10-CM | POA: Diagnosis not present

## 2019-12-06 DIAGNOSIS — R17 Unspecified jaundice: Secondary | ICD-10-CM | POA: Diagnosis present

## 2019-12-06 DIAGNOSIS — C787 Secondary malignant neoplasm of liver and intrahepatic bile duct: Secondary | ICD-10-CM

## 2019-12-06 DIAGNOSIS — I9589 Other hypotension: Secondary | ICD-10-CM | POA: Diagnosis present

## 2019-12-06 DIAGNOSIS — G893 Neoplasm related pain (acute) (chronic): Secondary | ICD-10-CM

## 2019-12-06 DIAGNOSIS — F17290 Nicotine dependence, other tobacco product, uncomplicated: Secondary | ICD-10-CM | POA: Diagnosis present

## 2019-12-06 DIAGNOSIS — Z79899 Other long term (current) drug therapy: Secondary | ICD-10-CM

## 2019-12-06 DIAGNOSIS — D72829 Elevated white blood cell count, unspecified: Secondary | ICD-10-CM | POA: Diagnosis present

## 2019-12-06 DIAGNOSIS — J9811 Atelectasis: Secondary | ICD-10-CM | POA: Diagnosis present

## 2019-12-06 DIAGNOSIS — Z5111 Encounter for antineoplastic chemotherapy: Secondary | ICD-10-CM | POA: Diagnosis present

## 2019-12-06 DIAGNOSIS — Z7189 Other specified counseling: Secondary | ICD-10-CM | POA: Diagnosis not present

## 2019-12-06 DIAGNOSIS — F39 Unspecified mood [affective] disorder: Secondary | ICD-10-CM | POA: Diagnosis present

## 2019-12-06 DIAGNOSIS — C786 Secondary malignant neoplasm of retroperitoneum and peritoneum: Secondary | ICD-10-CM | POA: Diagnosis present

## 2019-12-06 DIAGNOSIS — Z20822 Contact with and (suspected) exposure to covid-19: Secondary | ICD-10-CM | POA: Diagnosis present

## 2019-12-06 DIAGNOSIS — T451X5A Adverse effect of antineoplastic and immunosuppressive drugs, initial encounter: Secondary | ICD-10-CM | POA: Diagnosis present

## 2019-12-06 DIAGNOSIS — E872 Acidosis: Secondary | ICD-10-CM | POA: Diagnosis present

## 2019-12-06 DIAGNOSIS — Z9221 Personal history of antineoplastic chemotherapy: Secondary | ICD-10-CM

## 2019-12-06 DIAGNOSIS — R180 Malignant ascites: Secondary | ICD-10-CM | POA: Diagnosis present

## 2019-12-06 DIAGNOSIS — R110 Nausea: Secondary | ICD-10-CM

## 2019-12-06 DIAGNOSIS — E875 Hyperkalemia: Secondary | ICD-10-CM | POA: Diagnosis present

## 2019-12-06 DIAGNOSIS — R188 Other ascites: Secondary | ICD-10-CM | POA: Diagnosis present

## 2019-12-06 DIAGNOSIS — R112 Nausea with vomiting, unspecified: Secondary | ICD-10-CM | POA: Diagnosis not present

## 2019-12-06 DIAGNOSIS — N170 Acute kidney failure with tubular necrosis: Secondary | ICD-10-CM | POA: Diagnosis not present

## 2019-12-06 DIAGNOSIS — E861 Hypovolemia: Secondary | ICD-10-CM | POA: Diagnosis present

## 2019-12-06 DIAGNOSIS — R109 Unspecified abdominal pain: Secondary | ICD-10-CM | POA: Diagnosis not present

## 2019-12-06 LAB — CMP (CANCER CENTER ONLY)
ALT: 43 U/L (ref 0–44)
AST: 121 U/L — ABNORMAL HIGH (ref 15–41)
Albumin: 2 g/dL — ABNORMAL LOW (ref 3.5–5.0)
Alkaline Phosphatase: 435 U/L — ABNORMAL HIGH (ref 38–126)
Anion gap: 15 (ref 5–15)
BUN: 29 mg/dL — ABNORMAL HIGH (ref 6–20)
CO2: 21 mmol/L — ABNORMAL LOW (ref 22–32)
Calcium: 9.4 mg/dL (ref 8.9–10.3)
Chloride: 91 mmol/L — ABNORMAL LOW (ref 98–111)
Creatinine: 2.51 mg/dL — ABNORMAL HIGH (ref 0.61–1.24)
GFR, Est AFR Am: 32 mL/min — ABNORMAL LOW (ref >60)
GFR, Estimated: 28 mL/min — ABNORMAL LOW (ref >60)
Glucose, Bld: 105 mg/dL — ABNORMAL HIGH (ref 70–99)
Potassium: 4.7 mmol/L (ref 3.5–5.1)
Sodium: 127 mmol/L — ABNORMAL LOW (ref 135–145)
Total Bilirubin: 3.2 mg/dL — ABNORMAL HIGH (ref 0.3–1.2)
Total Protein: 7 g/dL (ref 6.5–8.1)

## 2019-12-06 LAB — HIV ANTIBODY (ROUTINE TESTING W REFLEX): HIV Screen 4th Generation wRfx: NONREACTIVE

## 2019-12-06 LAB — ABO/RH: ABO/RH(D): O POS

## 2019-12-06 LAB — PROTIME-INR
INR: 1 (ref 0.8–1.2)
Prothrombin Time: 12.9 seconds (ref 11.4–15.2)

## 2019-12-06 LAB — CBC WITH DIFFERENTIAL (CANCER CENTER ONLY)
Abs Immature Granulocytes: 0.4 10*3/uL — ABNORMAL HIGH (ref 0.00–0.07)
Basophils Absolute: 0.1 10*3/uL (ref 0.0–0.1)
Basophils Relative: 1 %
Eosinophils Absolute: 0.2 10*3/uL (ref 0.0–0.5)
Eosinophils Relative: 2 %
HCT: 32 % — ABNORMAL LOW (ref 39.0–52.0)
Hemoglobin: 10.2 g/dL — ABNORMAL LOW (ref 13.0–17.0)
Immature Granulocytes: 4 %
Lymphocytes Relative: 5 %
Lymphs Abs: 0.6 10*3/uL — ABNORMAL LOW (ref 0.7–4.0)
MCH: 26.8 pg (ref 26.0–34.0)
MCHC: 31.9 g/dL (ref 30.0–36.0)
MCV: 84.2 fL (ref 80.0–100.0)
Monocytes Absolute: 1.5 10*3/uL — ABNORMAL HIGH (ref 0.1–1.0)
Monocytes Relative: 13 %
Neutro Abs: 8.3 10*3/uL — ABNORMAL HIGH (ref 1.7–7.7)
Neutrophils Relative %: 75 %
Platelet Count: 460 10*3/uL — ABNORMAL HIGH (ref 150–400)
RBC: 3.8 MIL/uL — ABNORMAL LOW (ref 4.22–5.81)
RDW: 18.2 % — ABNORMAL HIGH (ref 11.5–15.5)
WBC Count: 11 10*3/uL — ABNORMAL HIGH (ref 4.0–10.5)
nRBC: 0 % (ref 0.0–0.2)

## 2019-12-06 LAB — URINALYSIS, ROUTINE W REFLEX MICROSCOPIC
Bilirubin Urine: NEGATIVE
Glucose, UA: NEGATIVE mg/dL
Ketones, ur: NEGATIVE mg/dL
Leukocytes,Ua: NEGATIVE
Nitrite: NEGATIVE
Protein, ur: 30 mg/dL — AB
Specific Gravity, Urine: 1.014 (ref 1.005–1.030)
pH: 5 (ref 5.0–8.0)

## 2019-12-06 LAB — TYPE AND SCREEN
ABO/RH(D): O POS
Antibody Screen: NEGATIVE

## 2019-12-06 LAB — SARS CORONAVIRUS 2 BY RT PCR (HOSPITAL ORDER, PERFORMED IN ~~LOC~~ HOSPITAL LAB): SARS Coronavirus 2: NEGATIVE

## 2019-12-06 LAB — MRSA PCR SCREENING: MRSA by PCR: NEGATIVE

## 2019-12-06 LAB — LACTIC ACID, PLASMA: Lactic Acid, Venous: 1.9 mmol/L (ref 0.5–1.9)

## 2019-12-06 MED ORDER — MORPHINE SULFATE (PF) 2 MG/ML IV SOLN
2.0000 mg | INTRAVENOUS | Status: DC | PRN
  Administered 2019-12-06 – 2019-12-07 (×4): 2 mg via INTRAVENOUS
  Filled 2019-12-06 (×4): qty 1

## 2019-12-06 MED ORDER — ORAL CARE MOUTH RINSE
15.0000 mL | Freq: Two times a day (BID) | OROMUCOSAL | Status: DC
  Administered 2019-12-06 – 2019-12-09 (×5): 15 mL via OROMUCOSAL

## 2019-12-06 MED ORDER — PROCHLORPERAZINE MALEATE 10 MG PO TABS
ORAL_TABLET | ORAL | Status: AC
  Filled 2019-12-06: qty 1

## 2019-12-06 MED ORDER — MAGIC MOUTHWASH W/LIDOCAINE
5.0000 mL | Freq: Four times a day (QID) | ORAL | Status: DC | PRN
  Filled 2019-12-06: qty 5

## 2019-12-06 MED ORDER — HEPARIN SODIUM (PORCINE) 5000 UNIT/ML IJ SOLN
5000.0000 [IU] | Freq: Three times a day (TID) | INTRAMUSCULAR | Status: DC
  Administered 2019-12-08 (×2): 5000 [IU] via SUBCUTANEOUS
  Filled 2019-12-06 (×4): qty 1

## 2019-12-06 MED ORDER — PROCHLORPERAZINE MALEATE 10 MG PO TABS
10.0000 mg | ORAL_TABLET | Freq: Four times a day (QID) | ORAL | Status: DC | PRN
  Administered 2019-12-06: 10 mg via ORAL

## 2019-12-06 MED ORDER — OXYCODONE-ACETAMINOPHEN 5-325 MG PO TABS
1.0000 | ORAL_TABLET | ORAL | Status: DC | PRN
  Administered 2019-12-07 – 2019-12-08 (×3): 2 via ORAL
  Administered 2019-12-08: 1 via ORAL
  Filled 2019-12-06: qty 2
  Filled 2019-12-06: qty 1
  Filled 2019-12-06 (×2): qty 2

## 2019-12-06 MED ORDER — SODIUM CHLORIDE 0.9% FLUSH: 10.0000 mL | INTRAVENOUS | Status: DC | PRN

## 2019-12-06 MED ORDER — SODIUM CHLORIDE 0.9 % IV SOLN
Freq: Once | INTRAVENOUS | Status: AC
  Filled 2019-12-06: qty 250

## 2019-12-06 MED ORDER — ONDANSETRON HCL 4 MG/2ML IJ SOLN
4.0000 mg | Freq: Once | INTRAMUSCULAR | Status: AC
  Administered 2019-12-06: 4 mg via INTRAVENOUS
  Filled 2019-12-06: qty 2

## 2019-12-06 MED ORDER — MORPHINE SULFATE ER 15 MG PO TBCR: 15.0000 mg | EXTENDED_RELEASE_TABLET | Freq: Two times a day (BID) | ORAL | 0 refills | Status: DC

## 2019-12-06 MED ORDER — ONDANSETRON HCL 4 MG/2ML IJ SOLN
4.0000 mg | Freq: Four times a day (QID) | INTRAMUSCULAR | Status: DC | PRN
  Administered 2019-12-07 – 2019-12-08 (×3): 4 mg via INTRAVENOUS
  Filled 2019-12-06 (×3): qty 2

## 2019-12-06 MED ORDER — CHLORHEXIDINE GLUCONATE CLOTH 2 % EX PADS
6.0000 | MEDICATED_PAD | Freq: Every day | CUTANEOUS | Status: DC
  Administered 2019-12-06 – 2019-12-09 (×4): 6 via TOPICAL

## 2019-12-06 MED ORDER — MORPHINE SULFATE ER 15 MG PO TBCR
15.0000 mg | EXTENDED_RELEASE_TABLET | Freq: Two times a day (BID) | ORAL | Status: DC
  Administered 2019-12-06 – 2019-12-09 (×6): 15 mg via ORAL
  Filled 2019-12-06 (×6): qty 1

## 2019-12-06 MED ORDER — SODIUM CHLORIDE 0.9% FLUSH
10.0000 mL | Freq: Two times a day (BID) | INTRAVENOUS | Status: DC
  Administered 2019-12-06 – 2019-12-08 (×4): 10 mL

## 2019-12-06 MED ORDER — SODIUM CHLORIDE 0.9 % IV BOLUS
1000.0000 mL | Freq: Once | INTRAVENOUS | Status: AC
  Administered 2019-12-06: 1000 mL via INTRAVENOUS

## 2019-12-06 MED ORDER — SODIUM CHLORIDE 0.9 % IV SOLN: INTRAVENOUS | Status: AC

## 2019-12-06 MED ORDER — DULOXETINE HCL 20 MG PO CPEP
20.0000 mg | ORAL_CAPSULE | Freq: Every day | ORAL | Status: DC
  Administered 2019-12-06 – 2019-12-09 (×4): 20 mg via ORAL
  Filled 2019-12-06 (×5): qty 1

## 2019-12-06 MED ORDER — SENNOSIDES-DOCUSATE SODIUM 8.6-50 MG PO TABS: 1.0000 | ORAL_TABLET | Freq: Every evening | ORAL | Status: DC | PRN

## 2019-12-06 MED ORDER — FENTANYL CITRATE (PF) 100 MCG/2ML IJ SOLN
100.0000 ug | Freq: Once | INTRAMUSCULAR | Status: AC
  Administered 2019-12-06: 100 ug via INTRAVENOUS
  Filled 2019-12-06: qty 2

## 2019-12-06 MED FILL — MORPHINE SULF ER 15 MG TAB: 15 | 30 days supply | Qty: 60 | Fill #0

## 2019-12-06 NOTE — ED Triage Notes (Signed)
Arrives from the cancer center, C/C hypotension and weakness.

## 2019-12-06 NOTE — H&P (Signed)
History and Physical  Matix Henshaw WER:[id number] DOB: 02-Apr-1966 DOA: 12/06/2019   PCP: Deland Pretty, MD  Outpatient Specialists: Dr. [name] (oncology) Patient comi ng from: Cancer center   Chief Complaint: Nausea, vomiting, decreased oral intake, acute on chronic abdominal pain  HPI: Craig Flowers is a 54 y.o. male with medical history significant for metastatic colon cancer (liver mets, peritoneal metastasis) currently undergoing chemotherapy, chronic cancer-related pain, chronic nausea/vomiting/appetite related to malignancy, HTN who presents on 12/06/2019 with worsening abdominal pain, increased nausea, increased vomiting and found to have hypotension, AKI during oncology office visit.  Patient states at baseline he has minimal oral intake with chronic nausea and intermittent emesis.  But for the past week he is only been able to tolerate minimal liquids due to persistent nausea, vomiting sometimes bowel fluid at least once a day, and worsening pain in his lower abdomen.  He denies any fevers, chills, chest pain, shortness of breath.  He was evaluated at his oncologist office prior to admission and was found to have a blood pressure of 94/60.  After receiving fluids patient's repeat blood pressure of 79/50.  Patient was initially planned to be a direct mission to hospital due to recent hypotension patient was sent to ED.  Additionally creatinine during that work-up was 2.51 (baseline 0.5) with elevated bilirubin.  Patient has hypertension and he took his lisinopril this morning    ED course: Afebrile, initial blood pressure 81/51, improved to 99/68.  Lactic acid 1.9.  Blood cultures x2 were obtained.   CT abdomen showed progressive peritoneal carcinomatosis with increased omental caking and new moderate ascites with increase in size of hepatic metastasis and new left adrenal metastasis as well as small bilateral pleural effusions.  Chest x-ray showed atelectasis and pleural effusions as  above    Review of Systems:As mentioned in the history of present illness.Review of systems are otherwise negative Patient seen in the ED.   Past Medical History:  Diagnosis Date  . Cancer ([place])   . Hypertension    Past Surgical History:  Procedure Laterality Date  . IR IMAGING GUIDED PORT INSERTION  11/17/2019   No Known Allergies Social History:  reports that he has never smoked. His smokeless tobacco use includes snuff. He reports current alcohol use. He reports that he does not use drugs. History reviewed. No pertinent family history.    Prior to Admission medications   Medication Sig Start Date End Date Taking? Authorizing Provider  acetaminophen (TYLENOL) 325 MG tablet Take 650 mg by mouth every 6 (six) hours as needed (Take with tramadol.).    [provider]  dexamethasone (DECADRON) 4 MG tablet Take 1 tablet twice a day for 3-4 days following chemotherapy for nausea 11/26/19   Heilingoetter, Cassandra L, PA-C  DULoxetine (CYMBALTA) 20 MG capsule Take 1 capsule (20 mg total) by mouth daily. 11/26/19   Heilingoetter, Cassandra L, PA-C  lidocaine-prilocaine (EMLA) cream Apply 1 application topically as needed. 11/18/19   Truitt Merle, MD  losartan (COZAAR) 100 MG tablet Take 100 mg by mouth daily.    [provider]  magic mouthwash w/lidocaine SOLN Take 5 mLs by mouth 4 (four) times daily as needed for mouth pain. 11/26/19   Heilingoetter, Cassandra L, PA-C  morphine (MS CONTIN) 15 MG 12 hr tablet Take 1 tablet (15 mg total) by mouth every 12 (twelve) hours. 12/06/19   Heilingoetter, Cassandra L, PA-C  Multiple Vitamin (MULTIVITAMIN WITH MINERALS) TABS tablet Take 1 tablet by mouth daily.    [provider]  ondansetron (ZOFRAN ODT) 8 MG disintegrating tablet Take 1 tablet (8 mg total) by mouth every 8 (eight) hours as needed for nausea or vomiting. 11/13/19   Truitt Merle, MD  oxyCODONE (OXY IR/ROXICODONE) 5 MG immediate release tablet Take 1-2 tablets (5-10 mg  total) by mouth every 6 (six) hours as needed for severe pain. Patient not taking: Reported on 12/06/2019 11/26/19   Heilingoetter, Cassandra L, PA-C  oxyCODONE-acetaminophen (PERCOCET/ROXICET) 5-325 MG tablet Take 1-2 tablets by mouth every 4 (four) hours as needed for severe pain. 11/06/19   Ladell Pier, MD  prochlorperazine (COMPAZINE) 10 MG tablet Take 1 tablet (10 mg total) by mouth every 6 (six) hours as needed for nausea or vomiting. 11/13/19   Truitt Merle, MD  vitamin C (ASCORBIC ACID) 250 MG tablet Take 500 mg by mouth daily.    [provider]    Physical Exam: BP 104/61   Pulse 99   Temp 98.2 F (36.8 C) (Oral)   Resp 20   Ht 6' 1"  (1.854 m)   Wt 91.1 kg   SpO2 93%   BMI 26.50 kg/m   Constitutional normal appearing male Eyes: icteric sclera ENMT: Oropharynx with moist mucous membranes, normal dentition Cardiovascular: RRR no MRGs, with no peripheral edema Respiratory: Normal respiratory effort on room air, decreased breath sounds at bases L> R, crackles at bases, no wheezing or rhonchi  Abdomen: Soft,tenderness in lower quadrants, distended, decreased bowel sounds, no rebound tenderness or guarding Skin: No rash ulcers, or lesions. Without skin tenting  Neurologic: Grossly no focal neuro deficit. Psychiatric:flat affect, and mood. Mental status AAOx3          Labs on Admission:  Basic Metabolic Panel: Recent Labs  Lab 12/06/19 0915  NA 127*  K 4.7  CL 91*  CO2 21*  GLUCOSE 105*  BUN 29*  CREATININE 2.51*  CALCIUM 9.4   Liver Function Tests: Recent Labs  Lab 12/06/19 0915  AST 121*  ALT 43  ALKPHOS 435*  BILITOT 3.2*  PROT 7.0  ALBUMIN 2.0*   No results for input(s): LIPASE, AMYLASE in the last 168 hours. No results for input(s): AMMONIA in the last 168 hours. CBC: Recent Labs  Lab 12/06/19 0915  WBC 11.0*  NEUTROABS 8.3*  HGB 10.2*  HCT 32.0*  MCV 84.2  PLT 460*   Cardiac Enzymes: No results for input(s): CKTOTAL, CKMB,  CKMBINDEX, TROPONINI in the last 168 hours.  BNP (last 3 results) No results for input(s): BNP in the last 8760 hours.  ProBNP (last 3 results) No results for input(s): PROBNP in the last 8760 hours.  CBG: No results for input(s): GLUCAP in the last 168 hours.  Radiological Exams on Admission: CT Abdomen Pelvis Wo Contrast  Result Date: 12/06/2019 CLINICAL DATA:  Abdominal distension, abdominal pain greater in lower abdomen, history colon cancer, hypertension EXAM: CT ABDOMEN AND PELVIS WITHOUT CONTRAST TECHNIQUE: Multidetector CT imaging of the abdomen and pelvis was performed following the standard protocol without IV contrast. Sagittal and coronal MPR images reconstructed from axial data set. COMPARISON:  CT abdomen 10/27/2019 FINDINGS: Lower chest: Small RIGHT and minimal LEFT pleural effusions with basilar atelectasis Hepatobiliary: Numerous hepatic mass lesions consistent with widespread hepatic metastatic disease. LEFT lobe lesion 3.9 x 3.0 cm image 16 previously 3.7 x 2.9 cm. Additional lesions appear to have slightly increased in size as well. Gallbladder contracted. Pancreas: Unremarkable Spleen: Normal appearance Adrenals/Urinary Tract: 1.8 x 2.0 cm. Nonobstructing RIGHT renal calculus. No renal mass  or hydronephrosis. Ureters and bladder unremarkable. Normal appearance. New LEFT adrenal metastasis Stomach/Bowel: Stomach decompressed. No bowel dilatation or obstruction. Minimal distal colonic diverticulosis. Vascular/Lymphatic: Aorta normal caliber. Periportal adenopathy, largest node 2.2 cm short axis image 34. Reproductive: Prostate gland and seminal vesicles unremarkable Other: Moderate ascites. Progressive peritoneal carcinomatosis and slightly increased omental caking. No free air. No hernia. Musculoskeletal: Unremarkable IMPRESSION: Progressive peritoneal carcinomatosis with increased omental caking and new moderate ascites. Slight increases in sizes of hepatic metastases. New LEFT  adrenal metastasis. Small RIGHT and minimal LEFT pleural effusions with basilar atelectasis. Nonobstructing RIGHT renal calculus. Electronically Signed   By: Lavonia Dana M.D.   On: 12/06/2019 13:15   DG Chest Port 1 View  Result Date: 12/06/2019 CLINICAL DATA:  Hypotension and weakness. History of metastatic colon cancer. EXAM: PORTABLE CHEST 1 VIEW COMPARISON:  10/22/2019 FINDINGS: The left IJ power port is in good position. The cardiac silhouette, mediastinal and hilar contours are within normal limits and stable. Low lung volumes with vascular crowding and bibasilar atelectasis. There is a small right pleural effusion noted. The bony thorax is intact. IMPRESSION: 1. Low lung volumes with vascular crowding and bibasilar atelectasis. 2. Small right pleural effusion. Electronically Signed   By: Marijo Sanes M.D.   On: 12/06/2019 12:23    EKG: Independently reviewed. normal sinus rhythm.  Assessment/Plan Present on Admission: . Hypotension . Hyponatremia . AKI (acute kidney injury) ([place]) . Cancer related pain . Metastatic colon cancer to liver ([place]) . Nausea and vomiting . Ascites . Peritoneal carcinomatosis ([place]) . Elevated bilirubin . Normocytic anemia  Active Problems:   Metastatic colon cancer to liver ([facility])   AKI (acute kidney injury) ([place])   Cancer related pain   Nausea and vomiting   Hypotension   Hyponatremia   Ascites   Peritoneal carcinomatosis ([facility])   Elevated bilirubin   Normocytic anemia   Worsening nausea, vomiting and abdominal pain, likely econdary to progression of peritoneal carcinomatosis related to metastatic colon cancer. CT abdomen showed progressive metastatic disease (peritoneum, liver, adrenal gland, and now moderate ascites which is likely malignant in nature).  Given elevated bilirubin/alk phos would like to also rule out biliary obstruction.  Though distended abdomen remains soft, patient is afebrile doubt infectious etiology -Right upper quadrant  ultrasound -Continue IV antiemetics -IV morphine -Morphine, as recommended by his oncologist -Oncology added to care team -Tried to broach the topic of goals of care discussion, palliative care discussion seems beneficial  AKI, prerenal In setting of diminished oral intake, emesis related to above.  Creatinine 2.51 with BUN of 29, CO2 21.  UA largely unremarkable -Avoid nephrotoxins -IV fluids -Repeat BMP   Hypotension, suspect related to diminished oral intake due to above Perfusing well with normal lactic acid. -Hold home BP meds, continue IV fluids, reassess in a.m.  Hyponatremia, hypovolemic, Sodium 127.  Suspect related to diminished oral intake, in addition to emesis -IV fluids as above, reassess in a.m. -Repeat BMP in a.m., if no improvement can obtain urine/serum osmolality  Cancer-related pain, acute on chronic Likely acutely worsened in setting of progressive metastatic disease, also ruling out biliary obstruction -Continue MS Contin recommended by oncology -IV morphine for breakthrough pain control none  Normocytic anemia, chronic Baseline around 10-11.  Hemoglobin 10.2 on admission.  No reports of bleeding -Monitor CBC  Mood disorder, stable -Continue home Cymbalta  DVT prophylaxis: Heparin  Code Status: DNR, discussed on day of admission  Family Communication: Sister updated at bedside Disposition Plan: Expect greater than  2 midnight stay given patient has hypotension needs to be closely monitored in stepdown unit we will continue IV fluids abdominal pain/nausea/vomiting related to likely worsening metastatic disease  Consults called: Palliative, oncology  Admission status: Admitted as inpatient to stepdown unit      Desiree Hane MD [facility]  Pager [phone]  If 7PM-7AM, please contact night-coverage [email or web address] Password [facility]  12/06/2019, 10:12 PM

## 2019-12-06 NOTE — Care Plan (Signed)
[facility] Plan of Care Note  Patient: Craig Flowers    ZOX:[id number]  PCP: Craig Pretty, MD    DOB: [birth year]  DOS: 12/06/2019   Received a phone call from Facility: [facility] regarding transfer of Mr. Craig Flowers directly to [facility] for admission Requesting: Craig Heilingoetter, PA Reason for transfer: worsening nausea, decreased oral intake with AKI and elevated bili History: Metastatic colon cancer,  Vitals: 94/60, HR 104 Labs: Na 127, Cr 2.51, t bili 3.2 Treatment received: IVF  Plan of care: The patient will not be accepted as a direct admission to [facility] given worsening hypotension.  Repeat vitals per provider showed BP in 80s after IVF. Patient will be sent to ED. Agree with plan   Author: Oretha Milch, MD [facility] 12/06/2019  If 7PM-7AM, please contact night-coverage at [email or web address],

## 2019-12-06 NOTE — Progress Notes (Signed)
Pt transferred to ED via wheelchair.  Pt brought to recess B in ED.  Pt A&O.  Report to be given by Cassie H. PA.

## 2019-12-06 NOTE — ED Provider Notes (Signed)
[facility] DEPT Provider Note   CSN: [id number] Arrival date & time: 12/06/19  1118     History Chief Complaint  Patient presents with  . Dehydration    Craig Flowers is a 54 y.o. male.  He was recently diagnosed with colon cancer with mets to liver.  Has started chemotherapy with Dr. [name].  Having intractable abdominal pain and left sciatic pain along with nausea decreased appetite.  Went to clinic today and found to be hypotensive with worsening renal function.  Sent to the ED for admission.  He denies any fevers chills cough chest pain rectal bleeding or urinary discomfort.  Michela Pitcher he was so weak when he got on the floor to get something that he had to pull himself up with his arms.  The history is provided by the patient.  Weakness Severity:  Severe Onset quality:  Gradual Timing:  Constant Progression:  Worsening Chronicity:  New Context: dehydration   Relieved by:  Nothing Worsened by:  Activity Ineffective treatments:  Drinking fluids Associated symptoms: abdominal pain, difficulty walking, nausea and vomiting   Associated symptoms: no chest pain, no cough, no dysuria, no fever, no foul-smelling urine, no frequency, no hematochezia, no melena, no shortness of breath, no syncope and no vision change   Risk factors: new medications        Past Medical History:  Diagnosis Date  . Cancer ([place])   . Hypertension     Patient Active Problem List   Diagnosis Date Noted  . Mucositis 11/26/2019  . Cancer related pain 11/26/2019  . Nausea without vomiting 11/26/2019  . AKI (acute kidney injury) ([place]) 11/18/2019  . Metastatic colon cancer to liver ([place]) 11/12/2019  . Goals of care, counseling/discussion 10/29/2019    Past Surgical History:  Procedure Laterality Date  . IR IMAGING GUIDED PORT INSERTION  11/17/2019       No family history on file.  Social History   Tobacco Use  . Smoking status: Never Smoker  . Smokeless tobacco: Current  User    Types: Snuff  Vaping Use  . Vaping Use: Never used  Substance Use Topics  . Alcohol use: Yes  . Drug use: Never    Home Medications Prior to Admission medications   Medication Sig Start Date End Date Taking? Authorizing Provider  acetaminophen (TYLENOL) 325 MG tablet Take 650 mg by mouth every 6 (six) hours as needed (Take with tramadol.).    [provider]  dexamethasone (DECADRON) 4 MG tablet Take 1 tablet twice a day for 3-4 days following chemotherapy for nausea 11/26/19   Heilingoetter, Cassandra L, PA-C  DULoxetine (CYMBALTA) 20 MG capsule Take 1 capsule (20 mg total) by mouth daily. 11/26/19   Heilingoetter, Cassandra L, PA-C  lidocaine-prilocaine (EMLA) cream Apply 1 application topically as needed. 11/18/19   Truitt Merle, MD  losartan (COZAAR) 100 MG tablet Take 100 mg by mouth daily.    [provider]  magic mouthwash w/lidocaine SOLN Take 5 mLs by mouth 4 (four) times daily as needed for mouth pain. 11/26/19   Heilingoetter, Cassandra L, PA-C  morphine (MS CONTIN) 15 MG 12 hr tablet Take 1 tablet (15 mg total) by mouth every 12 (twelve) hours. 12/06/19   Heilingoetter, Cassandra L, PA-C  Multiple Vitamin (MULTIVITAMIN WITH MINERALS) TABS tablet Take 1 tablet by mouth daily.    [provider]  ondansetron (ZOFRAN ODT) 8 MG disintegrating tablet Take 1 tablet (8 mg total) by mouth every 8 (eight) hours as  needed for nausea or vomiting. 11/13/19   Truitt Merle, MD  oxyCODONE (OXY IR/ROXICODONE) 5 MG immediate release tablet Take 1-2 tablets (5-10 mg total) by mouth every 6 (six) hours as needed for severe pain. Patient not taking: Reported on 12/06/2019 11/26/19   Heilingoetter, Cassandra L, PA-C  oxyCODONE-acetaminophen (PERCOCET/ROXICET) 5-325 MG tablet Take 1-2 tablets by mouth every 4 (four) hours as needed for severe pain. 11/06/19   Ladell Pier, MD  prochlorperazine (COMPAZINE) 10 MG tablet Take 1 tablet (10 mg total) by mouth every 6 (six) hours as  needed for nausea or vomiting. 11/13/19   Truitt Merle, MD  vitamin C (ASCORBIC ACID) 250 MG tablet Take 500 mg by mouth daily.    [provider]    Allergies    Patient has no known allergies.  Review of Systems   Review of Systems  Constitutional: Negative for fever.  HENT: Negative for sore throat.   Eyes: Negative for visual disturbance.  Respiratory: Negative for cough and shortness of breath.   Cardiovascular: Negative for chest pain and syncope.  Gastrointestinal: Positive for abdominal pain, nausea and vomiting. Negative for hematochezia and melena.  Genitourinary: Negative for dysuria and frequency.  Musculoskeletal: Positive for back pain and gait problem.  Skin: Negative for rash.  Neurological: Positive for weakness and light-headedness.    Physical Exam Updated Vital Signs BP (!) 94/54 (BP Location: Right Arm)   Pulse 90   Temp (!) 97.5 F (36.4 C) (Oral)   Resp (!) 24   SpO2 96%   Physical Exam Vitals and nursing note reviewed.  Constitutional:      Appearance: [name] well-developed.  HENT:     Head: Normocephalic and atraumatic.  Eyes:     Conjunctiva/sclera: Conjunctivae normal.  Cardiovascular:     Rate and Rhythm: Normal rate and regular rhythm.     Heart sounds: No murmur heard.   Pulmonary:     Effort: Pulmonary effort is normal. No respiratory distress.     Breath sounds: Normal breath sounds.  Abdominal:     General: There is distension.     Palpations: Abdomen is soft.     Tenderness: There is abdominal tenderness.  Musculoskeletal:        General: No deformity or signs of injury. Normal range of motion.     Cervical back: Neck supple.  Skin:    General: Skin is warm and dry.  Neurological:     General: No focal deficit present.     Mental Status: [name] alert and oriented to person, place, and time.     ED Results / Procedures / Treatments   Labs (all labs ordered are listed, but only abnormal results are displayed) Labs  Reviewed  SARS CORONAVIRUS 2 BY RT PCR ([facility])  CULTURE, BLOOD (ROUTINE X 2)  CULTURE, BLOOD (ROUTINE X 2)  URINE CULTURE  MRSA PCR SCREENING  LACTIC ACID, PLASMA  PROTIME-INR  URINALYSIS, ROUTINE W REFLEX MICROSCOPIC  HIV ANTIBODY (ROUTINE TESTING W REFLEX)  TYPE AND SCREEN  ABO/RH    EKG EKG Interpretation  Date/Time:  Monday December 06 2019 11:41:40 EDT Ventricular Rate:  87 PR Interval:    QRS Duration: 103 QT Interval:  384 QTC Calculation: 462 R Axis:   16 Text Interpretation: Sinus rhythm Low voltage, precordial leads No old tracing to compare Confirmed by Aletta Edouard [phone]) on 12/06/2019 12:20:09 PM   Radiology CT Abdomen Pelvis Wo Contrast  Result  Date: 12/06/2019 CLINICAL DATA:  Abdominal distension, abdominal pain greater in lower abdomen, history colon cancer, hypertension EXAM: CT ABDOMEN AND PELVIS WITHOUT CONTRAST TECHNIQUE: Multidetector CT imaging of the abdomen and pelvis was performed following the standard protocol without IV contrast. Sagittal and coronal MPR images reconstructed from axial data set. COMPARISON:  CT abdomen 10/27/2019 FINDINGS: Lower chest: Small RIGHT and minimal LEFT pleural effusions with basilar atelectasis Hepatobiliary: Numerous hepatic mass lesions consistent with widespread hepatic metastatic disease. LEFT lobe lesion 3.9 x 3.0 cm image 16 previously 3.7 x 2.9 cm. Additional lesions appear to have slightly increased in size as well. Gallbladder contracted. Pancreas: Unremarkable Spleen: Normal appearance Adrenals/Urinary Tract: 1.8 x 2.0 cm. Nonobstructing RIGHT renal calculus. No renal mass or hydronephrosis. Ureters and bladder unremarkable. Normal appearance. New LEFT adrenal metastasis Stomach/Bowel: Stomach decompressed. No bowel dilatation or obstruction. Minimal distal colonic diverticulosis. Vascular/Lymphatic: Aorta normal caliber. Periportal adenopathy, largest node 2.2 cm short axis  image 34. Reproductive: Prostate gland and seminal vesicles unremarkable Other: Moderate ascites. Progressive peritoneal carcinomatosis and slightly increased omental caking. No free air. No hernia. Musculoskeletal: Unremarkable IMPRESSION: Progressive peritoneal carcinomatosis with increased omental caking and new moderate ascites. Slight increases in sizes of hepatic metastases. New LEFT adrenal metastasis. Small RIGHT and minimal LEFT pleural effusions with basilar atelectasis. Nonobstructing RIGHT renal calculus. Electronically Signed   By: Lavonia Dana M.D.   On: 12/06/2019 13:15   DG Chest Port 1 View  Result Date: 12/06/2019 CLINICAL DATA:  Hypotension and weakness. History of metastatic colon cancer. EXAM: PORTABLE CHEST 1 VIEW COMPARISON:  10/22/2019 FINDINGS: The left IJ power port is in good position. The cardiac silhouette, mediastinal and hilar contours are within normal limits and stable. Low lung volumes with vascular crowding and bibasilar atelectasis. There is a small right pleural effusion noted. The bony thorax is intact. IMPRESSION: 1. Low lung volumes with vascular crowding and bibasilar atelectasis. 2. Small right pleural effusion. Electronically Signed   By: Marijo Sanes M.D.   On: 12/06/2019 12:23    Procedures Procedures (including critical care time)  Medications Ordered in ED Medications  ondansetron (ZOFRAN) injection 4 mg (has no administration in time range)  0.9 %  sodium chloride infusion ( Intravenous Rate/Dose Verify 12/06/19 1600)  DULoxetine (CYMBALTA) DR capsule 20 mg (has no administration in time range)  magic mouthwash w/lidocaine (has no administration in time range)  morphine (MS CONTIN) 12 hr tablet 15 mg (has no administration in time range)  oxyCODONE-acetaminophen (PERCOCET/ROXICET) 5-325 MG per tablet 1-2 tablet (has no administration in time range)  morphine 2 MG/ML injection 2 mg (has no administration in time range)  heparin injection 5,000 Units  (has no administration in time range)  senna-docusate (Senokot-S) tablet 1 tablet (has no administration in time range)  MEDLINE mouth rinse (15 mLs Mouth Rinse Given 12/06/19 1549)  Chlorhexidine Gluconate Cloth 2 % PADS 6 each (6 each Topical Given 12/06/19 1536)  sodium chloride flush (NS) 0.9 % injection 10-40 mL (10 mLs Intracatheter Given 12/06/19 1546)  sodium chloride flush (NS) 0.9 % injection 10-40 mL (has no administration in time range)  sodium chloride 0.9 % bolus 1,000 mL (0 mLs Intravenous Stopped 12/06/19 1311)  ondansetron (ZOFRAN) injection 4 mg (4 mg Intravenous Given 12/06/19 1311)  fentaNYL (SUBLIMAZE) injection 100 mcg (100 mcg Intravenous Given 12/06/19 1308)    ED Course  I have reviewed the triage vital signs and the nursing notes.  Pertinent labs & imaging results that were available during my  care of the patient were reviewed by me and considered in my medical decision making (see chart for details).    MDM Rules/Calculators/A&P                         This patient complains of low blood pressure, nausea and abdominal pain in the setting of metastatic colon cancer; this involves an extensive number of treatment Options and is a complaint that carries with it a high risk of complications and Morbidity. The differential includes sepsis, Sirs, dehydration, renal failure, metabolic derangement, obstruction, perforation  I ordered, reviewed and interpreted labs, which included CBC and CMP drawn from oncology clinic reviewed by me showing worsening renal failure.  Added on lactate and blood cultures, lactate not elevated, Covid testing negative, normal coags I ordered medication IV fluids pain and nausea medication with improvement in his symptoms I ordered imaging studies which included CT abdomen and pelvis and I independently    visualized and interpreted imaging which showed worsening metastatic disease and new ascites Additional history obtained from patient's  wife Previous records obtained and reviewed in epic including oncology note from today I consulted [facility] hospitalist Dr. Lonny Prude and discussed lab and imaging findings  Critical Interventions: None  After the interventions stated above, I reevaluated the patient and found patient's blood pressure to be somewhat improved.  He will need admission for further work-up and management of this along with controlling his pain and nausea.  Final Clinical Impression(s) / ED Diagnoses Final diagnoses:  Metastatic colon cancer to liver ([place])  AKI (acute kidney injury) ([place])  Malignant ascites  Hypotension, unspecified hypotension type    Rx / DC Orders ED Discharge Orders    None       Hayden Rasmussen, MD 12/06/19 1655

## 2019-12-06 NOTE — ED Notes (Signed)
Report given to 2W

## 2019-12-06 NOTE — Progress Notes (Signed)
[name] OFFICE PROGRESS NOTE  Deland Pretty, [name] [name] [address]  DIAGNOSIS: Metastatic coloncancer  Oncology History Overview Note  Cancer Staging No matching staging information was found for the patient.    Metastatic colon cancer to liver ([place])  10/27/2019 Imaging   CT abdomen 10/27/19 at [facility] IMPRESSION: Mass lesion associated with the right hemicolon with numerous hepatic metastatic lesions and extensive peritoneal and mesenteric metastasis. Findings suspicious for primary colonic neoplasm with metastasis.  8 mm left lower lobe pulmonary nodule concerning for potential metastatic lesion.  Bilateral nephrolithiasis without evidence of obstruction.   11/04/2019 Imaging   CT Chest 11/04/19 IMPRESSION: 1. Multiple bilateral pulmonary nodules, consistent with pulmonary metastatic disease. 2. Small right, trace left pleural effusions, presumed malignant. 3. Numerous hypodense lesions of the liver, consistent with hepatic metastatic disease. 4. Trace ascites.   11/10/2019 Initial Biopsy   FINAL MICROSCOPIC DIAGNOSIS: 11/10/19  A. LIVER, RIGHT LOBE, NEEDLE CORE BIOPSY:  - Adenocarcinoma, consistent with colorectal type primary.  - See comment.  COMMENT:  Given the clinical findings, the histology is consistent with the above  diagnosis.  The case was discussed with Dr. [name] on 11/11/2019.  MMR and  Foundation One testing will be performed and the results reported  separately.    11/12/2019 Initial Diagnosis   Metastatic colon cancer to liver ([name])   11/18/2019 -  Chemotherapy   FOLFOX q2weeks starting 11/18/19. Avastin to be added with C2.      CURRENT THERAPY: FOLFOX q2weeks starting 11/18/19. Status post 1 cycle.   INTERVAL HISTORY: Craig Flowers 54 y.o. male returns to the clinic today for follow-up visit.  The patient is accompanied by his sister today.  The patient had a challenging time with his first cycle of  chemotherapy which was on 11/18/2019 with nausea, vomiting, cancer-related pain, fatigue, and poor appetite. He reports that he does not feel up for treatment today.   The patient was last seen for a toxicity check on 11/26/2019.  Since that appointment, the patient has been feeling even worse.  The patient continues to have a very poor appetite in which he states he is hardly eating any solid food. He eats a "1/2 a biscuit". [name] trying to drink supplemental protein shakes as recommended by the dietitian. He also tries to drink water.  Per chart review, the patient has not lost any weight since his last appointment; however, the patient's abdomen does appear to be more bloated/distended today. He was given a prescription at his last appointment for decadron 4 mg BID to take for the next 3-4 days following his upcoming chemotherapy to help with his appetite and nausea. Also discussed with pharmacy that there can be additional adjustments made to his pre-medications due to his nausea/vomiting following treatment.  Additionally, this AM, the patient took his lisinopril, oxycodone, and zofran and reportedly vomited.  The patient is also endorsing considerable pain not adequately controlled by his oxycodone.  At the patient's last appointment, it was discussed that he may take 1 to 2 tablets of 5 mg of his oxycodone every 6 hours as needed for pain.  The patient estimates that he averages approximately 3-4 tablets of oxycodone daily.  He also recently started 20 mg of Cymbalta daily without appreciable difference in his pain. He has multiple liver lesions as well as extensive peritoneal and mesenteric metastasis. The patient localizes most of his pain today to the left flank area/left lateral abdomen which radiates  to the suprapubic area.  He also has pain radiating to his left quad area.  He notes some abdominal distention/bloating.  The patient denies any hematuria or dysuria. Denies history of nephrolithiasis,  although his staging scan noted some non-obstructive renal calculus. He denies any fevers. The patient denies any current diarrhea or constipation.  His last bowel movement was this morning and was of normal color and consistency.  The patient describes his pain as "back spasms to the gut".   The patient also notes that he has developed a new rash underneath the right axillary region in the last week.  He denies any new soaps, lotions, detergents, or deodorant.  He has been using hydrocortisone cream which has been helping.   He denies any chills or night sweats.  He denies any peripheral neuropathy.  He denies any chest pain, shortness of breath, cough, or hemoptysis.  [name] here today for evaluation and repeat blood work prior to starting his scheduled cycle #2 treatment.  MEDICAL HISTORY: Past Medical History:  Diagnosis Date  . Cancer ([place])   . Hypertension     ALLERGIES:  has No Known Allergies.  MEDICATIONS:  No current facility-administered medications for this visit.   Current Outpatient Medications  Medication Sig Dispense Refill  . dexamethasone (DECADRON) 4 MG tablet Take 1 tablet twice a day for 3-4 days following chemotherapy for nausea 40 tablet 2  . DULoxetine (CYMBALTA) 20 MG capsule Take 1 capsule (20 mg total) by mouth daily. 30 capsule 3  . lidocaine-prilocaine (EMLA) cream Apply 1 application topically as needed. (Patient taking differently: Apply 1 application topically daily as needed (access port). ) 30 g 0  . losartan (COZAAR) 100 MG tablet Take 50 mg by mouth daily.     . magic mouthwash w/lidocaine SOLN Take 5 mLs by mouth 4 (four) times daily as needed for mouth pain. 240 mL 0  . Multiple Vitamin (MULTIVITAMIN WITH MINERALS) TABS tablet Take 1 tablet by mouth daily.    . ondansetron (ZOFRAN ODT) 8 MG disintegrating tablet Take 1 tablet (8 mg total) by mouth every 8 (eight) hours as needed for nausea or vomiting. 30 tablet 2  . prochlorperazine (COMPAZINE) 10 MG  tablet Take 1 tablet (10 mg total) by mouth every 6 (six) hours as needed for nausea or vomiting. 30 tablet 3  . acetaminophen (TYLENOL) 325 MG tablet Take 650 mg by mouth every 6 (six) hours as needed (Take with tramadol.).    Marland Kitchen Ascorbic Acid (VITAMIN C) 500 MG CHEW Chew 1 tablet by mouth daily.    Marland Kitchen morphine (MS CONTIN) 15 MG 12 hr tablet Take 1 tablet (15 mg total) by mouth every 12 (twelve) hours. 60 tablet 0  . oxyCODONE (OXY IR/ROXICODONE) 5 MG immediate release tablet Take 1-2 tablets (5-10 mg total) by mouth every 6 (six) hours as needed for severe pain. 90 tablet 0  . traZODone (DESYREL) 50 MG tablet TAKE 1   1.5   2 TABLETS AT BEDTIME AS NEEDED (Patient not taking: Reported on 12/06/2019)     Facility-Administered Medications Ordered in Other Visits  Medication Dose Route Frequency Provider Last Rate Last Admin  . 0.9 %  sodium chloride infusion   Intravenous Continuous Oretha Milch D, MD      . Chlorhexidine Gluconate Cloth 2 % PADS 6 each  6 each Topical Daily Desiree Hane, MD   6 each at 12/06/19 1536  . DULoxetine (CYMBALTA) DR capsule 20 mg  20 mg  Oral Daily Oretha Milch D, MD      . heparin injection 5,000 Units  5,000 Units Subcutaneous Q8H Oretha Milch D, MD      . magic mouthwash w/lidocaine  5 mL Oral QID PRN Oretha Milch D, MD      . MEDLINE mouth rinse  15 mL Mouth Rinse BID Oretha Milch D, MD      . morphine (MS CONTIN) 12 hr tablet 15 mg  15 mg Oral Q12H Oretha Milch D, MD      . morphine 2 MG/ML injection 2 mg  2 mg Intravenous Q3H PRN Oretha Milch D, MD      . ondansetron (ZOFRAN) injection 4 mg  4 mg Intravenous Q6H PRN Oretha Milch D, MD      . oxyCODONE-acetaminophen (PERCOCET/ROXICET) 5-325 MG per tablet 1-2 tablet  1-2 tablet Oral Q4H PRN Oretha Milch D, MD      . senna-docusate (Senokot-S) tablet 1 tablet  1 tablet Oral QHS PRN Oretha Milch D, MD      . sodium chloride flush (NS) 0.9 % injection 10-40 mL  10-40 mL Intracatheter Q12H Nettey,  Myrlene Broker D, MD      . sodium chloride flush (NS) 0.9 % injection 10-40 mL  10-40 mL Intracatheter PRN Desiree Hane, MD        SURGICAL HISTORY:  Past Surgical History:  Procedure Laterality Date  . IR IMAGING GUIDED PORT INSERTION  11/17/2019    REVIEW OF SYSTEMS:   Review of Systems  Constitutional: Positive for decreased appetite, fatigue, generalized weakness.  Negative for chills, fever and unexpected weight change.  HENT: Negative for mouth sores (improved), nosebleeds, sore throat and trouble swallowing.   Eyes: Negative for eye problems and icterus.  Respiratory: Positive for dry cough. Negative for hemoptysis, shortness of breath and wheezing.   Cardiovascular: Negative for chest pain and leg swelling.  Gastrointestinal: Positive for abdominal pain, nausea, and vomiting. Negative for constipation and diarrhea,  Genitourinary: Negative for bladder incontinence, difficulty urinating, dysuria, frequency and hematuria.   Musculoskeletal: Positive for left flank pain. Negative for gait problem, neck pain and neck stiffness.  Skin: Positive for itching and rash.  Neurological: Negative for dizziness, extremity weakness, gait problem, headaches, light-headedness and seizures.  Hematological: Negative for adenopathy. Does not bruise/bleed easily.  Psychiatric/Behavioral: Negative for confusion, depression and sleep disturbance. The patient is not nervous/anxious.     PHYSICAL EXAMINATION:  Blood pressure 94/60, pulse (!) 104, temperature (!) 97.3 F (36.3 C), temperature source Temporal, resp. rate 18, height 6' 1"  (1.854 m), weight 204 lb 12.8 oz (92.9 kg), SpO2 97 %.  ECOG PERFORMANCE STATUS: 2 - Symptomatic, <50% confined to bed  Physical Exam  Constitutional: Oriented to person, place, and time and well-developed, well-nourished, and in no distress.  HENT:  Head: Normocephalic and atraumatic.  Mouth/Throat: Poor dentition Eyes: Conjunctivae are normal. Right eye exhibits no  discharge. Left eye exhibits no discharge. No scleral icterus.  Neck: Normal range of motion. Neck supple.  Cardiovascular: Normal rate, regular rhythm, normal heart sounds and intact distal pulses.   Pulmonary/Chest: Effort normal. Decreased breath sounds at base of lungs. No respiratory distress. No wheezes. No rales.  Abdominal: Firm abdomen. Decreased bowel sounds. Abdominal distention noted. No rebound tenderness or guarding.  Musculoskeletal: Normal range of motion. Exhibits no edema.  Lymphadenopathy:    No cervical adenopathy.  Neurological: Alert and oriented to person, place, and time. Exhibits normal muscle tone. Gait normal. Coordination normal.  Skin:  Rash bilaterally posterior upper extremity. Not diaphoretic. No  No pallor.  Psychiatric: Flat affect. Mood, memory and judgment normal.  Vitals reviewed.  LABORATORY DATA: Lab Results  Component Value Date   WBC 11.0 (H) 12/06/2019   HGB 10.2 (L) 12/06/2019   HCT 32.0 (L) 12/06/2019   MCV 84.2 12/06/2019   PLT 460 (H) 12/06/2019      Chemistry      Component Value Date/Time   NA 127 (L) 12/06/2019 0915   K 4.7 12/06/2019 0915   CL 91 (L) 12/06/2019 0915   CO2 21 (L) 12/06/2019 0915   BUN 29 (H) 12/06/2019 0915   CREATININE 2.51 (H) 12/06/2019 0915      Component Value Date/Time   CALCIUM 9.4 12/06/2019 0915   ALKPHOS 435 (H) 12/06/2019 0915   AST 121 (H) 12/06/2019 0915   ALT 43 12/06/2019 0915   BILITOT 3.2 (H) 12/06/2019 0915       RADIOGRAPHIC STUDIES:  CT Abdomen Pelvis Wo Contrast  Result Date: 12/06/2019 CLINICAL DATA:  Abdominal distension, abdominal pain greater in lower abdomen, history colon cancer, hypertension EXAM: CT ABDOMEN AND PELVIS WITHOUT CONTRAST TECHNIQUE: Multidetector CT imaging of the abdomen and pelvis was performed following the standard protocol without IV contrast. Sagittal and coronal MPR images reconstructed from axial data set. COMPARISON:  CT abdomen 10/27/2019 FINDINGS: Lower  chest: Small RIGHT and minimal LEFT pleural effusions with basilar atelectasis Hepatobiliary: Numerous hepatic mass lesions consistent with widespread hepatic metastatic disease. LEFT lobe lesion 3.9 x 3.0 cm image 16 previously 3.7 x 2.9 cm. Additional lesions appear to have slightly increased in size as well. Gallbladder contracted. Pancreas: Unremarkable Spleen: Normal appearance Adrenals/Urinary Tract: 1.8 x 2.0 cm. Nonobstructing RIGHT renal calculus. No renal mass or hydronephrosis. Ureters and bladder unremarkable. Normal appearance. New LEFT adrenal metastasis Stomach/Bowel: Stomach decompressed. No bowel dilatation or obstruction. Minimal distal colonic diverticulosis. Vascular/Lymphatic: Aorta normal caliber. Periportal adenopathy, largest node 2.2 cm short axis image 34. Reproductive: Prostate gland and seminal vesicles unremarkable Other: Moderate ascites. Progressive peritoneal carcinomatosis and slightly increased omental caking. No free air. No hernia. Musculoskeletal: Unremarkable IMPRESSION: Progressive peritoneal carcinomatosis with increased omental caking and new moderate ascites. Slight increases in sizes of hepatic metastases. New LEFT adrenal metastasis. Small RIGHT and minimal LEFT pleural effusions with basilar atelectasis. Nonobstructing RIGHT renal calculus. Electronically Signed   By: Lavonia Dana M.D.   On: 12/06/2019 13:15   DG Chest Port 1 View  Result Date: 12/06/2019 CLINICAL DATA:  Hypotension and weakness. History of metastatic colon cancer. EXAM: PORTABLE CHEST 1 VIEW COMPARISON:  10/22/2019 FINDINGS: The left IJ power port is in good position. The cardiac silhouette, mediastinal and hilar contours are within normal limits and stable. Low lung volumes with vascular crowding and bibasilar atelectasis. There is a small right pleural effusion noted. The bony thorax is intact. IMPRESSION: 1. Low lung volumes with vascular crowding and bibasilar atelectasis. 2. Small right pleural  effusion. Electronically Signed   By: Marijo Sanes M.D.   On: 12/06/2019 12:23   [place] CORE BIOPSY (LIVER)  Result Date: 11/10/2019 CLINICAL DATA:  Colon mass.  Multiple liver lesions. EXAM: ULTRASOUND-GUIDED CORE LIVER BIOPSY TECHNIQUE: An ultrasound guided liver biopsy was thoroughly discussed with the patient and questions were answered. The benefits, risks, alternatives, and complications were also discussed. The patient understands and wishes to proceed with the procedure. A verbal as well as written consent was obtained. Survey ultrasound of the liver was performed, representative lesion localized, and an  appropriate skin entry site was determined. Skin site was marked, prepped with chlorhexidine, and draped in usual sterile fashion, and infiltrated locally with 1% lidocaine. Intravenous Fentanyl 66mg and Versed 135mwere administered as conscious sedation during continuous monitoring of the patient's level of consciousness and physiological / cardiorespiratory status by the radiology RN, with a total moderate sedation time of 20 minutes. A 17 gauge trocar needle was advanced under ultrasound guidance into the liver to the margin of the lesion. 3 coaxial 18gauge core samples were then obtained through the guide needle. The guide needle was removed. Post procedure scans demonstrate no apparent complication. COMPLICATIONS: COMPLICATIONS None immediate FINDINGS: Ill-defined hepatic lesions were localized corresponding to CT findings. Representative core biopsy samples obtained as above. IMPRESSION: 1. Technically successful ultrasound guided core liver lesion biopsy. Electronically Signed   By: D Lucrezia Europe.D.   On: 11/10/2019 15:17   IR IMAGING GUIDED PORT INSERTION  Result Date: 11/17/2019 INDICATION: [age]ear old male with colonic adenocarcinoma metastatic to the liver. He presents for port catheter placement for durable venous access. EXAM: IMPLANTED PORT A CATH PLACEMENT WITH ULTRASOUND AND FLUOROSCOPIC  GUIDANCE MEDICATIONS: 2 g Ancef; The antibiotic was administered within an appropriate time interval prior to skin puncture. ANESTHESIA/SEDATION: Versed 4 mg IV; Fentanyl 100 mcg IV; Moderate Sedation Time:  23 minutes The patient was continuously monitored during the procedure by the interventional radiology nurse under my direct supervision. FLUOROSCOPY TIME:  0 minutes, 12 seconds (2 mGy) COMPLICATIONS: None immediate. PROCEDURE: The left neck and chest was prepped with chlorhexidine, and draped in the usual sterile fashion using maximum barrier technique (cap and mask, sterile gown, sterile gloves, large sterile sheet, hand hygiene and cutaneous antiseptic). Local anesthesia was attained by infiltration with 1% lidocaine with epinephrine. Ultrasound demonstrated patency of the left internal jugular vein, and this was documented with an image. Under real-time ultrasound guidance, this vein was accessed with a 21 gauge micropuncture needle and image documentation was performed. A small dermatotomy was made at the access site with an 11 scalpel. A 0.018" wire was advanced into the SVC and the access needle exchanged for a 68F micropuncture vascular sheath. The 0.018" wire was then removed and a 0.035" wire advanced into the IVC. An appropriate location for the subcutaneous reservoir was selected below the clavicle and an incision was made through the skin and underlying soft tissues. The subcutaneous tissues were then dissected using a combination of blunt and sharp surgical technique and a pocket was formed. A single lumen power injectable portacatheter was then tunneled through the subcutaneous tissues from the pocket to the dermatotomy and the port reservoir placed within the subcutaneous pocket. The venous access site was then serially dilated and a peel away vascular sheath placed over the wire. The wire was removed and the port catheter advanced into position under fluoroscopic guidance. The catheter tip is  positioned in the upper right atrium. This was documented with a spot image. The portacatheter was then tested and found to flush and aspirate well. The port was flushed with saline followed by 100 units/mL heparinized saline. The pocket was then closed in two layers using first subdermal inverted interrupted absorbable sutures followed by a running subcuticular suture. The epidermis was then sealed with Dermabond. The dermatotomy at the venous access site was also closed with Dermabond. IMPRESSION: Successful placement of a left IJ approach Power Port with ultrasound and fluoroscopic guidance. The catheter is ready for use. Electronically Signed   By: HeDellis Filbert.  On: 11/17/2019 15:10     ASSESSMENT/PLAN:  Craig Cooperis a 54 y.o.malewith   1.Stage IVright sidecoloncancer with liver, lung,peritoneal and mesenteric metastasis. -He was diagnosed in May 2021 after presenting to his primary care provider with a chief complaint of abdominal pain. His baseline LFTs demonstrated a GGT slightly elevated at 130, alk phos elevated at 250, AST slightly elevated at 48,and AST within normal limits at 33.His CT AP from 10/28/19 showedmass lesion associated with the right hemicolon with numerous hepatic metastatic lesions and extensive peritoneal and mesenteric metastasis.There is also a 55m left lower lobe pulmonary nodule concerning for potential metastatic lesion.  -His liver biopsyfrom6/2/21 showedAdenocarcinoma, consistent with colorectal type primary.Given metastatic colon cancer, no colonoscopy is needed. It was discussed with stage IV disease, his cancer is no longer curable but still treatable with goal to control his disease and prolong his life.  -Dr. FBurr Medicopreviously discussed surgery is not recommended at this time unless he develops bowel blockage. She consulted with Dr TMarlou Starks  -Dr. FBurr Medicostarted him on first lineFOLFOXq2weeks on 11/18/19. He had PAC placed 11/17/19. Will  holdbevacizumab with first cycle chemotherapy due to the new port placement. -Plan to monitor his response with scan after 2-3 months treatment and monitor tumor marker. If his cancer is well controlled may consider maintenance therapy with 5FU or Xeloda alone. -Received reduced dose oxaliplatin with first cycle due to AKI.  -Today 12/06/2019, the patient was seen with Dr. FBurr Medico The patient's labs demonstrate  -Labs reviewed, CBC Hg 10.2, plt 460K, ANC 8.3, BG 105, Creatinine demonstrates AKI with creatinine at 2.51 today. Bilirubin elevated at 3.2. Sodium 127, AST 121, Alk phos 435.   -Patient's vitals initially 94/60. Patient took lisinopril today. Upon recheck BP 79/50 and 81/51. Patient initially was going to be directly admitted to hospital; however, due to worsening hypotension, patient sent to emergency room for further evaluation and management of his condition  2. Acute Kidney Injury/Hypotension -Creatinine 2.51 today from 0.95 10 days ago. -Patient started on 1 L normal saline while in the clinic today -BP 94/60. Patient took lisinopril this AM. Instructed to discontinue lisinopril at this time.   3. Elevated Bilirubin/LFTs -Bilirubin 3.2 from 0.9 last week  -recommend further evaluation in hospital. Worsening metastatic disease vs. biliary obstruction. If biliary obstruction, would recommend consideration of biliary stent placement.  -Dr. FBurr Medicorecommends further imaging studies to further evaluate this.   4. Abdominal pain, Nausea and Fatigue, weight loss  -He did not tolerate Tramadol as it exacerbated his nausea. When he went to ED he was given IV Morphine which helped. He was discharged with percocet [id number] is working well. -His symptoms have much improved after pain better controlled.Now his main pain is abdominal cramping. He has taken half tabletPercocet [id number] hashelped. -Experienced worsening abdominal pain following chemotherapy for 4-5 days.  Advised that he may take 1-2 tablets every 4-6 hours of 5 mg oxycodone for cancer related pain -Reinforced him use prophylactic Miralax to avoid constipation given he already has partial bowel obstruction. -Dr. FenBurr Medicoso recommended Cymbalta 20 mg for his peritoneal involvement/neuropathic pain.  -Decadron 4 mg BID 3-4 days following chemotherapy sent to patient's pharmacy. Also will hopefully help appetite starting with cycle #2 -Today 12/06/2019, patient endorsing poor nutritional intake and significant cancer related pain. Sent prescription for long acting morphine 15 mg BID to his pharmacy if patient recovers from this admission.  -Given compazine in the clinic today.   4. Mucositis -Following cycle #1 -MMW sent to  patient's pharmacy if needed starting with cycle #2.    5.Financial Assistance -[name] uninsured.Once definitive diagnosis, social work will reach out to the patient to help the patient apply to assistive programs if he qualifies.  -Social work is already aware of the patientand is working with him setting up [employer] and [employer]. He may be eligible for disability due to stage IV cancer.  -He can use Grants to cover medications through [facility] outpatient pharmacy. [name] interested.  -If he is Eligible for Avastin, may look for drug replacement for coverage.   PLAN: -Patient to be evaluated in ER due to hypotension, AKI, elevated bilirubin  -1L normal saline started in clinic with 10 mg of compazine -Discontinue lisinopril due to hypotension/AKI -Recommend consideration of imaging studies to assess for worsening metastatic disease and to evaluate elevated bilirubin to rule out biliary obstruction.    No orders of the defined types were placed in this encounter.    Dejanira Pamintuan L Sharvi Mooneyhan, PA-C 12/06/19   Addendum   I have seen the patient, examined him. I agree with the assessment and and plan and have edited the notes.   Pt started first line chemo FOLFOX on 6/10,  did well the first week after chemo, but has developed worsening abdominal pain, bloating, poor appetite and fatigue.  Labs reviewed, he has developed acute renal failure pain, and worsening liver function.  He was borderline hypotensive when he checked in.  I will hold his chemotherapy today, and give IV fluids in the clinic.  Will call hospitalist team for admission.  Patient later on developed hypotension in the clinic, and was sent to [facility] emergency room.  I will follow-up with him in the hospital.  Truitt Merle  12/06/2019

## 2019-12-06 NOTE — Telephone Encounter (Signed)
Called Dr. Pennie Banter office and spoke with Gay Filler to advise of blood pressure readings in our office also in lieu of taking Lisinopril.  Request for PCP to follow up on patient after discharge to ensure that is has been discontinued and further follow up after to determine any necessity to resume when patients condition gets better.

## 2019-12-06 NOTE — Progress Notes (Signed)
Patient requesting [place] abd. to be scheduled for 12/07/19 in the morning, so that he can eat now. Patient will remain NPO after midnight. [place] tech should be in 12/07/19 around 6am.

## 2019-12-06 NOTE — Patient Instructions (Signed)

## 2019-12-06 NOTE — Progress Notes (Signed)
Per Fritz Pickerel, PA, patient was taken to ED via wheelchair with RN and Nurse Tech. Patient's sister notified and informed to meet patient in ED.

## 2019-12-06 NOTE — ED Notes (Signed)
Patient transported to CT 

## 2019-12-06 NOTE — Patient Instructions (Signed)

## 2019-12-07 ENCOUNTER — Inpatient Hospital Stay (HOSPITAL_COMMUNITY): Payer: Medicaid Other

## 2019-12-07 DIAGNOSIS — G893 Neoplasm related pain (acute) (chronic): Secondary | ICD-10-CM

## 2019-12-07 DIAGNOSIS — E871 Hypo-osmolality and hyponatremia: Secondary | ICD-10-CM

## 2019-12-07 DIAGNOSIS — Z515 Encounter for palliative care: Secondary | ICD-10-CM

## 2019-12-07 DIAGNOSIS — N179 Acute kidney failure, unspecified: Secondary | ICD-10-CM

## 2019-12-07 DIAGNOSIS — Z7189 Other specified counseling: Secondary | ICD-10-CM

## 2019-12-07 LAB — COMPREHENSIVE METABOLIC PANEL
ALT: 34 U/L (ref 0–44)
AST: 106 U/L — ABNORMAL HIGH (ref 15–41)
Albumin: 2 g/dL — ABNORMAL LOW (ref 3.5–5.0)
Alkaline Phosphatase: 359 U/L — ABNORMAL HIGH (ref 38–126)
Anion gap: 7 (ref 5–15)
BUN: 35 mg/dL — ABNORMAL HIGH (ref 6–20)
CO2: 25 mmol/L (ref 22–32)
Calcium: 8.4 mg/dL — ABNORMAL LOW (ref 8.9–10.3)
Chloride: 94 mmol/L — ABNORMAL LOW (ref 98–111)
Creatinine, Ser: 3.16 mg/dL — ABNORMAL HIGH (ref 0.61–1.24)
GFR calc Af Amer: 24 mL/min — ABNORMAL LOW (ref >60)
GFR calc non Af Amer: 21 mL/min — ABNORMAL LOW (ref >60)
Glucose, Bld: 109 mg/dL — ABNORMAL HIGH (ref 70–99)
Potassium: 4.4 mmol/L (ref 3.5–5.1)
Sodium: 126 mmol/L — ABNORMAL LOW (ref 135–145)
Total Bilirubin: 3.2 mg/dL — ABNORMAL HIGH (ref 0.3–1.2)
Total Protein: 6.2 g/dL — ABNORMAL LOW (ref 6.5–8.1)

## 2019-12-07 LAB — CBC
HCT: 28.8 % — ABNORMAL LOW (ref 39.0–52.0)
Hemoglobin: 9.1 g/dL — ABNORMAL LOW (ref 13.0–17.0)
MCH: 26.8 pg (ref 26.0–34.0)
MCHC: 31.6 g/dL (ref 30.0–36.0)
MCV: 84.7 fL (ref 80.0–100.0)
Platelets: 403 10*3/uL — ABNORMAL HIGH (ref 150–400)
RBC: 3.4 MIL/uL — ABNORMAL LOW (ref 4.22–5.81)
RDW: 18.4 % — ABNORMAL HIGH (ref 11.5–15.5)
WBC: 10.6 10*3/uL — ABNORMAL HIGH (ref 4.0–10.5)
nRBC: 0 % (ref 0.0–0.2)

## 2019-12-07 MED ORDER — MUSCLE RUB 10-15 % EX CREA
1.0000 "application " | TOPICAL_CREAM | CUTANEOUS | Status: DC | PRN
  Administered 2019-12-07: 1 via TOPICAL
  Filled 2019-12-07: qty 85

## 2019-12-07 MED ORDER — ENSURE ENLIVE PO LIQD
237.0000 mL | Freq: Three times a day (TID) | ORAL | Status: DC
  Administered 2019-12-07 – 2019-12-09 (×2): 237 mL via ORAL

## 2019-12-07 MED ORDER — METHOCARBAMOL 1000 MG/10ML IJ SOLN
500.0000 mg | Freq: Three times a day (TID) | INTRAVENOUS | Status: DC | PRN
  Administered 2019-12-07: 500 mg via INTRAVENOUS
  Filled 2019-12-07: qty 500
  Filled 2019-12-07: qty 5

## 2019-12-07 MED ORDER — ADULT MULTIVITAMIN W/MINERALS CH
1.0000 | ORAL_TABLET | Freq: Every day | ORAL | Status: DC
  Administered 2019-12-08 – 2019-12-09 (×2): 1 via ORAL
  Filled 2019-12-07 (×2): qty 1

## 2019-12-07 NOTE — Progress Notes (Signed)
Report given to receiving RN on 6E. Pt to transfer to 1612.

## 2019-12-07 NOTE — Progress Notes (Signed)
Initial Nutrition Assessment  DOCUMENTATION CODES:   Not applicable  INTERVENTION:  Ensure Enlive po TID, each supplement provides 350 kcal and 20 grams of protein  Magic cup TID with meals, each supplement provides 290 kcal and 9 grams of protein  MVI daily  NUTRITION DIAGNOSIS:   Increased nutrient needs related to cancer and cancer related treatments as evidenced by estimated needs.    GOAL:   Patient will meet greater than or equal to 90% of their needs    MONITOR:   PO intake, Weight trends, Labs, I & O's  REASON FOR ASSESSMENT:   Malnutrition Screening Tool    ASSESSMENT:   Pt presented with worsening N/V, abdominal pain, hypotension, hyponatremia, and AKI. PMH significant for metastatic colon cancer (liver mets, peritoneal metastasis) currently undergoing chemotherapy, chronic cancer-related pain, and HTN.  Pt unable to answer RD questions at this time.   Per H&P, pt has minimal oral intake with chronic nausea and intermittent emesis. For the past week, he has only been able to tolerate minimal liquids.   No significant wt loss noted per wt readings.   No PO Intake documented.   Labs: Na 126 (L) Medications reviewed.   NUTRITION - FOCUSED PHYSICAL EXAM:    Most Recent Value  Orbital Region No depletion  Upper Arm Region No depletion  Thoracic and Lumbar Region No depletion  Buccal Region No depletion  Temple Region Mild depletion  Clavicle Bone Region Mild depletion  Clavicle and Acromion Bone Region Mild depletion  Scapular Bone Region No depletion  Dorsal Hand No depletion  Patellar Region No depletion  Anterior Thigh Region No depletion  Posterior Calf Region No depletion  Edema (RD Assessment) Mild  Hair Reviewed  Eyes Reviewed  Mouth Reviewed  Skin Reviewed  Nails Reviewed       Diet Order:   Diet Order            Diet Heart Room service appropriate? No; Fluid consistency: Thin  Diet effective now                  EDUCATION NEEDS:   No education needs have been identified at this time  Skin:  Skin Assessment: Reviewed RN Assessment  Last BM:  6/28  Height:   Ht Readings from Last 1 Encounters:  12/06/19 6\' 1"  (1.854 m)    Weight:   Wt Readings from Last 10 Encounters:  12/07/19 92.5 kg  12/06/19 92.9 kg  11/26/19 92.6 kg  11/12/19 93.2 kg  11/10/19 95.7 kg  11/02/19 96.1 kg  11/01/19 95.7 kg  10/29/19 96.1 kg    BMI:  Body mass index is 26.9 kg/m.  Estimated Nutritional Needs:   Kcal:  6389-3734  Protein:  130-145 grams  Fluid:  >2L/d    Larkin Ina, MS, RD, LDN RD pager number and weekend/on-call pager number located in [place].

## 2019-12-07 NOTE — Consult Note (Signed)
Consultation Note Date: 12/07/2019   Patient Name: Craig Flowers  DOB: 08/19/1965  MRN: [id number]  Age / Sex: 54 y.o., male  PCP: Deland Pretty, MD Referring Physician: Bonnielee Haff, MD  Reason for Consultation: Establishing goals of care  HPI/Patient Profile: 54 y.o. male  with past medical history of hypertension, metastatic colon cancer with liver and peritoneal metastasis (currently undergoing chemotherapy), chronic cancer-related pain, chronic nausea/vomiting, and appetite changes related to malignancy admitted on 12/06/2019 with worsening N/V and abdominal pain related to metastatic colon cancer, acute on chronic cancer related pain, AKI, malignant ascites, hypotension, hyponatremia.   PMT was consulted for [place] discussion.  Patient and family face treatment option decisions, advanced directive decisions, and anticipatory care needs.   Clinical Assessment and Goals of Care: I have reviewed medical records including EPIC notes, labs and imaging, received report from primary RN, assessed the patient and then met with patient, sister/Terry, and father/Jerry at bedside to discuss diagnosis, GOC, disposition, and options.  I introduced Palliative Medicine as specialized medical care for people living with serious illness. It focuses on providing relief from the symptoms and stress of a serious illness. The goal is to improve quality of life for both the patient and the family.  We discussed a brief life review of the patient. Mr. Grenz lives at home alone; however, he states his sister and father live about 200 feet from his house and are available to help him with any needs that arise. [name] not married and does not have any children. He explained that he is usually a man that is "always on the go" and enjoys being outside. He particularly enjoys fishing and hunting. Mr. Yardley describes himself as part of the [place].  As far as  functional status, Mr. Santillana states that recently he has not been able to be as active as he would like due to his pain and general discomfort. We discussed patient's current illness and what it means in the larger context of patient's on-going co-morbidities. Natural disease trajectory were discussed. Mr. Nordahl stated that his first chemotherapy treatment was "rough," but expressed that his goal is to continue the treatments.   I attempted to elicit values and goals of care important to the patient. He would like help managing his pain so he can become more physically active to maintain functionality.   Advance directives were considered and discussed. The patient would like for his sister/Terry to be his primary medical decision maker.   Discussed with patient/family the importance of continued conversation with the medical providers regarding overall plan of care and treatment options, ensuring decisions are within the context of the patient's values and GOCs.    Questions and concerns were addressed. The family was encouraged to call with questions or concerns.    Primary Decision Maker OTHER  Sister - Coralyn Mark    SUMMARY OF RECOMMENDATIONS   -Continue current medical treatment -Continue DNR/DNI status as previously documented -Patient would like to continue chemotherapy at this time -Education offered on the use of pain medication when needed. Will monitor and adjust pain medications as needed based upon his clinical course.  -Patient requested help completing advanced directives to name sister/Terry primary decision maker - Spiritual Care consult placed -PMT will continue to follow holistically  Code Status/Advance Care Planning:  DNR  Palliative Prophylaxis:   Bowel Regimen and Frequent Pain Assessment  Additional Recommendations (Limitations, Scope, Preferences):  Full Scope Treatment  Psycho-social/Spiritual:   Desire for further Chaplaincy support:yes  Created space  and opportunity for patient and family to express thoughts and feelings regarding patient's current medical situation.  Emotional support provided.  Prognosis:   Unable to determine  Discharge Planning: To Be Determined      Primary Diagnoses: Present on Admission: . Hypotension . Hyponatremia . AKI (acute kidney injury) ([place]) . Cancer related pain . Metastatic colon cancer to liver ([place]) . Nausea and vomiting . Ascites . Peritoneal carcinomatosis ([place]) . Elevated bilirubin . Normocytic anemia   I have reviewed the medical record, interviewed the patient and family, and examined the patient. The following aspects are pertinent.  Past Medical History:  Diagnosis Date  . Cancer ([place])   . Hypertension    Social History   Socioeconomic History  . Marital status: Single    Spouse name: Not on file  . Number of children: Not on file  . Years of education: Not on file  . Highest education level: Not on file  Occupational History  . Not on file  Tobacco Use  . Smoking status: Never Smoker  . Smokeless tobacco: Current User    Types: Snuff  Vaping Use  . Vaping Use: Never used  Substance and Sexual Activity  . Alcohol use: Yes  . Drug use: Never  . Sexual activity: Not on file  Other Topics Concern  . Not on file  Social History Narrative  . Not on file   Social Determinants of Health   Financial Resource Strain:   . Difficulty of Paying Living Expenses:   Food Insecurity:   . Worried About Charity fundraiser in the Last Year:   . Arboriculturist in the Last Year:   Transportation Needs:   . Film/video editor (Medical):   Marland Kitchen Lack of Transportation (Non-Medical):   Physical Activity:   . Days of Exercise per Week:   . Minutes of Exercise per Session:   Stress:   . Feeling of Stress :   Social Connections:   . Frequency of Communication with Friends and Family:   . Frequency of Social Gatherings with Friends and Family:   . Attends Religious  Services:   . Active Member of Clubs or Organizations:   . Attends Archivist Meetings:   Marland Kitchen Marital Status:    History reviewed. No pertinent family history. Scheduled Meds: . Chlorhexidine Gluconate Cloth  6 each Topical Daily  . DULoxetine  20 mg Oral Daily  . feeding supplement (ENSURE ENLIVE)  237 mL Oral TID BM  . heparin  5,000 Units Subcutaneous Q8H  . mouth rinse  15 mL Mouth Rinse BID  . morphine  15 mg Oral Q12H  . multivitamin with minerals  1 tablet Oral Daily  . sodium chloride flush  10-40 mL Intracatheter Q12H   Continuous Infusions: . methocarbamol (ROBAXIN) IV     PRN Meds:.magic mouthwash w/lidocaine, methocarbamol (ROBAXIN) IV, morphine injection, Muscle Rub, ondansetron (ZOFRAN) IV, oxyCODONE-acetaminophen, senna-docusate, sodium chloride flush No Known Allergies Review of Systems  Respiratory: Negative for shortness of breath.   generalized pain  Physical Exam Vitals and nursing note reviewed.  Constitutional:      General: [name] not in acute distress. Pulmonary:     Effort: Pulmonary effort is normal. No respiratory distress.  Skin:    General: Skin is warm and dry.  Neurological:     Mental Status: [name] alert and easily aroused.     Comments: drowsy  Psychiatric:  Behavior: Behavior is cooperative.        Cognition and Memory: Cognition normal.     Vital Signs: BP (!) 97/55   Pulse 98   Temp 98 F (36.7 C) (Oral)   Resp (!) 22   Ht 6' 1"  (1.854 m)   Wt 92.5 kg   SpO2 95%   BMI 26.90 kg/m  Pain Scale: 0-10 POSS *See Group Information*: S-Acceptable,Sleep, easy to arouse Pain Score: 5    SpO2: SpO2: 95 % O2 Device:SpO2: 95 % O2 Flow Rate: .   IO: Intake/output summary:   Intake/Output Summary (Last 24 hours) at 12/07/2019 1533 Last data filed at 12/07/2019 1424 Gross per 24 hour  Intake 2163.07 ml  Output 220 ml  Net 1943.07 ml    LBM: Last BM Date: 12/06/19 Baseline Weight: Weight: 91.1 kg Most recent  weight: Weight: 92.5 kg     Palliative Assessment/Data: PPS 60%   Discussed case with primary RN, patient, sister, father  Time In: 1405 Time Out: 1515 Time Total: 70 minutes  Greater than 50%  of this time was spent counseling and coordinating care related to the above assessment and plan.  Micheline Rough, MD [facility] [phone]

## 2019-12-07 NOTE — Progress Notes (Signed)
[facility] PROGRESS NOTE   Craig Flowers JTT:[id number] DOB: 08-May-1966 DOA: 12/06/2019  PCP: Deland Pretty, MD  Brief History/Interval Summary: 54 y.o. male with medical history significant for metastatic colon cancer (liver mets, peritoneal metastasis) currently undergoing chemotherapy, chronic cancer-related pain, chronic nausea/vomiting/appetite related to malignancy, HTN who presented on 12/06/2019 with worsening abdominal pain, increased nausea, increased vomiting and found to have hypotension, AKI during oncology office visit.  He was sent over to the hospital for further management.  CT scan of the abdomen showed progressive peritoneal carcinomatosis with increased omental caking.  Moderate ascites was noted.   Reason for Visit: Nausea vomiting  Consultants: Oncology  Procedures: None yet  Antibiotics: Anti-infectives (From admission, onward)   None      Subjective/Interval History: Patient states that his nausea and vomiting appears to have subsided.  He was able to tolerate supper last night.  Denies any significant abdominal pain.  He does mention that his muscles ache all over including both the shoulders and both his thigh area.  Denies any recent falls or injuries.  ROS: No shortness of breath or cough.    Assessment/Plan:  Nausea vomiting abdominal pain likely due to progression of peritoneal carcinomatosis CT scan shows progressive disease.  He was found to have elevated bilirubin.  Ultrasound of the abdomen does not show any biliary ductal dilatation.  His AST is noted to be elevated.  Alkaline phosphatase is 359.  Bilirubin is 3.2 today.  His nausea and vomiting appears to have subsided.  There was no evidence of obstruction on the CT scan.  He tolerated his supper last night.  His creatinine however seems to have gone up slightly.  Continue to monitor.  Acute renal failure, prerenal Likely due to nausea vomiting and decreased oral intake.  Creatinine was  2.51 at admission.  Increased to 3.16 today.  Continue IV hydration.  Monitor urine output.  Baseline creatinine is normal.  No hydronephrosis noted on ultrasound.  Hypotension Likely due to hypovolemia.  Improved with IV hydration.  Hold his antihypertensives.  Hypovolemic hyponatremia Sodium is 126 this morning.  Continue normal saline for now.  Recheck labs tomorrow.  We will also check urine osmolality, TSH, cortisol levels.  Urine sodium.  Cancer related pain, acute on chronic Continue with his MS Contin.  [name] also on IV morphine and oral Percocet.  Normocytic anemia Likely due to chronic disease.  No evidence of overt bleeding.  Mild drop in hemoglobin is likely dilutional.  Mood disorder, stable Continue Cymbalta.  Muscle pain Patient complains of muscle pain diffusely.  He specifically mentioned pain in both his shoulders.  Decreased range of motion is noted.  No abnormal swelling noted in the shoulder or over the humerus.  He denies any falls.  He also has discomfort in his thighs.  We will try muscle relaxants.  Hold off on imaging studies for now.  Patient agreeable to this.   DVT Prophylaxis: Subcutaneous heparin Code Status: DNR Family Communication: Discussed with the patient.  No family at bedside Disposition Plan:  Status is: Inpatient  Remains inpatient appropriate because:IV treatments appropriate due to intensity of illness or inability to take PO   Dispo: The patient is from: Home              Anticipated d/c is to: Home              Anticipated d/c date is: 3 days  Patient currently is not medically stable to d/c.      Medications:  Scheduled:  Chlorhexidine Gluconate Cloth  6 each Topical Daily   DULoxetine  20 mg Oral Daily   heparin  5,000 Units Subcutaneous Q8H   mouth rinse  15 mL Mouth Rinse BID   morphine  15 mg Oral Q12H   sodium chloride flush  10-40 mL Intracatheter Q12H   Continuous:  sodium chloride 100 mL/hr at  12/07/19 0600   methocarbamol (ROBAXIN) IV     HCW:CBJSE mouthwash w/lidocaine, methocarbamol (ROBAXIN) IV, morphine injection, ondansetron (ZOFRAN) IV, oxyCODONE-acetaminophen, senna-docusate, sodium chloride flush   Objective:  Vital Signs  Vitals:   12/07/19 0400 12/07/19 0500 12/07/19 0600 12/07/19 0830  BP: 113/64 111/66 110/61   Pulse: 95 96 (!) 101   Resp: 18 19 (!) 22   Temp: 98.9 F (37.2 C)   98.2 F (36.8 C)  TempSrc: Oral   Oral  SpO2: 94% 93% 93%   Weight:  92.5 kg    Height:        Intake/Output Summary (Last 24 hours) at 12/07/2019 0944 Last data filed at 12/07/2019 0600 Gross per 24 hour  Intake 2394.14 ml  Output 220 ml  Net 2174.14 ml   Filed Weights   12/06/19 1541 12/07/19 0500  Weight: 91.1 kg 92.5 kg    General appearance: Awake alert.  In no distress Resp: Clear to auscultation bilaterally.  Normal effort Cardio: S1-S2 is normal regular.  No S3-S4.  No rubs murmurs or bruit GI: Abdominal is distended soft.  Tender diffusely without any rebound rigidity or guarding.  No masses organomegaly appreciated.   Extremities: No edema.  Limited range of motion of all of his extremities noted.  No obvious swelling.  No abnormality otherwise noted. Neurologic: Alert and oriented x3.  No focal neurological deficits.    Lab Results:  Data Reviewed: I have personally reviewed following labs and imaging studies  CBC: Recent Labs  Lab 12/06/19 0915 12/07/19 0345  WBC 11.0* 10.6*  NEUTROABS 8.3*  --   HGB 10.2* 9.1*  HCT 32.0* 28.8*  MCV 84.2 84.7  PLT 460* 403*    Basic Metabolic Panel: Recent Labs  Lab 12/06/19 0915 12/07/19 0345  NA 127* 126*  K 4.7 4.4  CL 91* 94*  CO2 21* 25  GLUCOSE 105* 109*  BUN 29* 35*  CREATININE 2.51* 3.16*  CALCIUM 9.4 8.4*    GFR: Estimated Creatinine Clearance: 29.9 mL/min (A) (by C-G formula based on SCr of 3.16 mg/dL (H)).  Liver Function Tests: Recent Labs  Lab 12/06/19 0915 12/07/19 0345  AST  121* 106*  ALT 43 34  ALKPHOS 435* 359*  BILITOT 3.2* 3.2*  PROT 7.0 6.2*  ALBUMIN 2.0* 2.0*     Coagulation Profile: Recent Labs  Lab 12/06/19 1142  INR 1.0      Recent Results (from the past 240 hour(s))  Culture, blood (routine x 2)     Status: None (Preliminary result)   Collection Time: 12/06/19 11:42 AM   Specimen: BLOOD  Result Value Ref Range Status   Specimen Description   Final    BLOOD PORTA CATH Performed at [facility] [address], [place], [place] [address]    Special Requests   Final    BOTTLES DRAWN AEROBIC AND ANAEROBIC Blood Culture adequate volume Performed at [facility] [address], [place], [place] [address]    Culture   Final  NO GROWTH < 24 HOURS Performed at [facility] [address], [place], [place] [address]    Report Status PENDING  Incomplete  Culture, blood (routine x 2)     Status: None (Preliminary result)   Collection Time: 12/06/19 11:42 AM   Specimen: BLOOD  Result Value Ref Range Status   Specimen Description   Final    BLOOD LEFT ANTECUBITAL Performed at [facility] [address], [place], [place] [address]    Special Requests   Final    BOTTLES DRAWN AEROBIC AND ANAEROBIC Blood Culture adequate volume Performed at [facility] [address] [place], [place] [address]    Culture   Final    NO GROWTH < 24 HOURS Performed at [facility] [address], [place], [place] [address]    Report Status PENDING  Incomplete  SARS Coronavirus 2 by RT PCR (hospital order, performed in [facility] hospital lab) Nasopharyngeal Nasopharyngeal Swab     Status: None   Collection Time: 12/06/19 11:42 AM   Specimen: Nasopharyngeal Swab  Result Value Ref Range Status   SARS Coronavirus 2 NEGATIVE NEGATIVE Final    Comment: (NOTE) SARS-CoV-2 target nucleic acids are NOT DETECTED.  The SARS-CoV-2 RNA is generally detectable in upper  and lower respiratory specimens during the acute phase of infection. The lowest concentration of SARS-CoV-2 viral copies this assay can detect is 250 copies / mL. A negative result does not preclude SARS-CoV-2 infection and should not be used as the sole basis for treatment or other patient management decisions.  A negative result may occur with improper specimen collection / handling, submission of specimen other than nasopharyngeal swab, presence of viral mutation(s) within the areas targeted by this assay, and inadequate number of viral copies (<250 copies / mL). A negative result must be combined with clinical observations, patient history, and epidemiological information.  Fact Sheet for Patients:   StrictlyIdeas.no  Fact Sheet for Healthcare Providers: BankingDealers.co.za  This test is not yet approved or  cleared by the [place] FDA and has been authorized for detection and/or diagnosis of SARS-CoV-2 by FDA under an Emergency Use Authorization (EUA).  This EUA will remain in effect (meaning this test can be used) for the duration of the COVID-19 declaration under Section 564(b)(1) of the Act, 21 U.S.C. section 360bbb-3(b)(1), unless the authorization is terminated or revoked sooner.  Performed at [facility], [place] [address] [place], [place] [address]   MRSA PCR Screening     Status: None   Collection Time: 12/06/19  3:35 PM   Specimen: Nasopharyngeal  Result Value Ref Range Status   MRSA by PCR NEGATIVE NEGATIVE Final    Comment:        The GeneXpert MRSA Assay (FDA approved for NASAL specimens only), is one component of a comprehensive MRSA colonization surveillance program. It is not intended to diagnose MRSA infection nor to guide or monitor treatment for MRSA infections. Performed at [facility], [place] [address] [place], [place] [address]       Radiology  Studies: CT Abdomen Pelvis Wo Contrast  Result Date: 12/06/2019 CLINICAL DATA:  Abdominal distension, abdominal pain greater in lower abdomen, history colon cancer, hypertension EXAM: CT ABDOMEN AND PELVIS WITHOUT CONTRAST TECHNIQUE: Multidetector CT imaging of the abdomen and pelvis was performed following the standard protocol without IV contrast. Sagittal and coronal MPR images reconstructed from axial data set. COMPARISON:  CT abdomen 10/27/2019 FINDINGS:  Lower chest: Small RIGHT and minimal LEFT pleural effusions with basilar atelectasis Hepatobiliary: Numerous hepatic mass lesions consistent with widespread hepatic metastatic disease. LEFT lobe lesion 3.9 x 3.0 cm image 16 previously 3.7 x 2.9 cm. Additional lesions appear to have slightly increased in size as well. Gallbladder contracted. Pancreas: Unremarkable Spleen: Normal appearance Adrenals/Urinary Tract: 1.8 x 2.0 cm. Nonobstructing RIGHT renal calculus. No renal mass or hydronephrosis. Ureters and bladder unremarkable. Normal appearance. New LEFT adrenal metastasis Stomach/Bowel: Stomach decompressed. No bowel dilatation or obstruction. Minimal distal colonic diverticulosis. Vascular/Lymphatic: Aorta normal caliber. Periportal adenopathy, largest node 2.2 cm short axis image 34. Reproductive: Prostate gland and seminal vesicles unremarkable Other: Moderate ascites. Progressive peritoneal carcinomatosis and slightly increased omental caking. No free air. No hernia. Musculoskeletal: Unremarkable IMPRESSION: Progressive peritoneal carcinomatosis with increased omental caking and new moderate ascites. Slight increases in sizes of hepatic metastases. New LEFT adrenal metastasis. Small RIGHT and minimal LEFT pleural effusions with basilar atelectasis. Nonobstructing RIGHT renal calculus. Electronically Signed   By: Lavonia Dana M.D.   On: 12/06/2019 13:15   US Abdomen Complete  Result Date: 12/07/2019 CLINICAL DATA:  Elevated bilirubin EXAM: ABDOMEN  ULTRASOUND COMPLETE COMPARISON:  Abdominal CT from yesterday FINDINGS: Gallbladder: No gallstones or wall thickening visualized. No sonographic Murphy sign noted by sonographer. Common bile duct: Diameter: 4 mm Liver: Heterogeneous liver from known metastatic disease by CT. Portal vein is patent on color Doppler imaging with normal direction of blood flow towards the liver. IVC: No abnormality visualized. Pancreas: Limited assessment Spleen: Size and appearance within normal limits. Right Kidney: Length: 12.3 cm. Echogenicity within normal limits. No mass or hydronephrosis visualized. Left Kidney: Length: 14 cm. Echogenicity within normal limits. No mass or hydronephrosis visualized. Abdominal aorta: No aneurysm visualized. Other findings: Trace ascites. Right pleural effusion. These findings are known from preceding abdominal CT IMPRESSION: No additional finding when compared with CT from yesterday. Diffuse hepatic metastatic disease; no bile duct dilatation. Electronically Signed   By: Monte Fantasia M.D.   On: 12/07/2019 06:52   DG Chest Port 1 View  Result Date: 12/06/2019 CLINICAL DATA:  Hypotension and weakness. History of metastatic colon cancer. EXAM: PORTABLE CHEST 1 VIEW COMPARISON:  10/22/2019 FINDINGS: The left IJ power port is in good position. The cardiac silhouette, mediastinal and hilar contours are within normal limits and stable. Low lung volumes with vascular crowding and bibasilar atelectasis. There is a small right pleural effusion noted. The bony thorax is intact. IMPRESSION: 1. Low lung volumes with vascular crowding and bibasilar atelectasis. 2. Small right pleural effusion. Electronically Signed   By: Marijo Sanes M.D.   On: 12/06/2019 12:23       LOS: 1 day   [facility] Pager on [email or web address]  12/07/2019, 9:44 AM

## 2019-12-07 NOTE — Plan of Care (Signed)
?  Problem: Clinical Measurements: ?Goal: Ability to maintain clinical measurements within normal limits will improve ?Outcome: Progressing ?Goal: Will remain free from infection ?Outcome: Progressing ?Goal: Diagnostic test results will improve ?Outcome: Progressing ?  ?

## 2019-12-07 NOTE — Progress Notes (Addendum)
HEMATOLOGY-ONCOLOGY PROGRESS NOTE  SUBJECTIVE: Blood pressure better this morning.  Still having a lot of fatigue and reports pain to his bilateral shoulders.  Reports that abdominal distention is about the same.  Bowels move this morning.  Denies nausea and vomiting.  States that he is voiding well-output not accurately recorded.  BUN and creatinine worse this morning.  Oncology History Overview Note  Cancer Staging No matching staging information was found for the patient.    Metastatic colon cancer to liver ([place])  10/27/2019 Imaging   CT abdomen 10/27/19 at [facility] IMPRESSION: Mass lesion associated with the right hemicolon with numerous hepatic metastatic lesions and extensive peritoneal and mesenteric metastasis. Findings suspicious for primary colonic neoplasm with metastasis.  8 mm left lower lobe pulmonary nodule concerning for potential metastatic lesion.  Bilateral nephrolithiasis without evidence of obstruction.   11/04/2019 Imaging   CT Chest 11/04/19 IMPRESSION: 1. Multiple bilateral pulmonary nodules, consistent with pulmonary metastatic disease. 2. Small right, trace left pleural effusions, presumed malignant. 3. Numerous hypodense lesions of the liver, consistent with hepatic metastatic disease. 4. Trace ascites.   11/10/2019 Initial Biopsy   FINAL MICROSCOPIC DIAGNOSIS: 11/10/19  A. LIVER, RIGHT LOBE, NEEDLE CORE BIOPSY:  - Adenocarcinoma, consistent with colorectal type primary.  - See comment.  COMMENT:  Given the clinical findings, the histology is consistent with the above  diagnosis.  The case was discussed with Dr. [name] on 11/11/2019.  MMR and  Foundation One testing will be performed and the results reported  separately.    11/12/2019 Initial Diagnosis   Metastatic colon cancer to liver ([place])   11/18/2019 -  Chemotherapy   FOLFOX q2weeks starting 11/18/19. Avastin to be added with C2.       REVIEW OF SYSTEMS:   Constitutional: Reports fatigue Respiratory:  Denies cough, dyspnea or wheezes Cardiovascular: Denies palpitation, chest discomfort Gastrointestinal: Reports abdominal distention which is unchanged, denies nausea and vomiting, bowels moving without any difficulty Skin: Denies abnormal skin rashes Lymphatics: Denies new lymphadenopathy or easy bruising Neurological:Denies numbness, tingling or new weaknesses Behavioral/Psych: Mood is stable, no new changes  Extremities: No lower extremity edema All other systems were reviewed with the patient and are negative.  I have reviewed the past medical history, past surgical history, social history and family history with the patient and they are unchanged from previous note.   PHYSICAL EXAMINATION: ECOG PERFORMANCE STATUS: 2 - Symptomatic, <50% confined to bed  Vitals:   12/07/19 0600 12/07/19 0830  BP: 110/61   Pulse: (!) 101   Resp: (!) 22   Temp:  98.2 F (36.8 C)  SpO2: 93%    Filed Weights   12/06/19 1541 12/07/19 0500  Weight: 91.1 kg 92.5 kg    Intake/Output from previous day: 06/28 0701 - 06/29 0700 In: 2394.1 [I.V.:1414.1; IV Piggyback:980] Out: 220 [Urine:220]  GENERAL: Chronically ill-appearing male, no distress SKIN: skin color, texture, turgor are normal, no rashes or significant lesions EYES: normal, Conjunctiva are pink and non-injected, sclera clear OROPHARYNX:no exudate, no erythema and lips, buccal mucosa, and tongue normal  LUNGS: clear to auscultation and percussion with normal breathing effort HEART: regular rate & rhythm and no murmurs and no lower extremity edema ABDOMEN: Positive bowel sounds, abdomen distended but nontender. NEURO: alert & oriented x 3 with fluent speech, no focal motor/sensory deficits  LABORATORY DATA:  I have reviewed the data as listed CMP Latest Ref Rng & Units 12/07/2019 12/06/2019 11/26/2019  Glucose 70 - 99 mg/dL 109(H) 105(H) 104(H)  BUN  6 - 20 mg/dL 35(H) 29(H) 13  Creatinine 0.61 - 1.24 mg/dL 3.16(H) 2.51(H) 0.95  Sodium  135 - 145 mmol/L 126(L) 127(L) 133(L)  Potassium 3.5 - 5.1 mmol/L 4.4 4.7 4.4  Chloride 98 - 111 mmol/L 94(L) 91(L) 99  CO2 22 - 32 mmol/L 25 21(L) 25  Calcium 8.9 - 10.3 mg/dL 8.4(L) 9.4 9.0  Total Protein 6.5 - 8.1 g/dL 6.2(L) 7.0 6.7  Total Bilirubin 0.3 - 1.2 mg/dL 3.2(H) 3.2(H) 0.9  Alkaline Phos 38 - 126 U/L 359(H) 435(H) 423(H)  AST 15 - 41 U/L 106(H) 121(H) 71(H)  ALT 0 - 44 U/L 34 43 32    Lab Results  Component Value Date   WBC 10.6 (H) 12/07/2019   HGB 9.1 (L) 12/07/2019   HCT 28.8 (L) 12/07/2019   MCV 84.7 12/07/2019   PLT 403 (H) 12/07/2019   NEUTROABS 8.3 (H) 12/06/2019    CT Abdomen Pelvis Wo Contrast  Result Date: 12/06/2019 CLINICAL DATA:  Abdominal distension, abdominal pain greater in lower abdomen, history colon cancer, hypertension EXAM: CT ABDOMEN AND PELVIS WITHOUT CONTRAST TECHNIQUE: Multidetector CT imaging of the abdomen and pelvis was performed following the standard protocol without IV contrast. Sagittal and coronal MPR images reconstructed from axial data set. COMPARISON:  CT abdomen 10/27/2019 FINDINGS: Lower chest: Small RIGHT and minimal LEFT pleural effusions with basilar atelectasis Hepatobiliary: Numerous hepatic mass lesions consistent with widespread hepatic metastatic disease. LEFT lobe lesion 3.9 x 3.0 cm image 16 previously 3.7 x 2.9 cm. Additional lesions appear to have slightly increased in size as well. Gallbladder contracted. Pancreas: Unremarkable Spleen: Normal appearance Adrenals/Urinary Tract: 1.8 x 2.0 cm. Nonobstructing RIGHT renal calculus. No renal mass or hydronephrosis. Ureters and bladder unremarkable. Normal appearance. New LEFT adrenal metastasis Stomach/Bowel: Stomach decompressed. No bowel dilatation or obstruction. Minimal distal colonic diverticulosis. Vascular/Lymphatic: Aorta normal caliber. Periportal adenopathy, largest node 2.2 cm short axis image 34. Reproductive: Prostate gland and seminal vesicles unremarkable Other:  Moderate ascites. Progressive peritoneal carcinomatosis and slightly increased omental caking. No free air. No hernia. Musculoskeletal: Unremarkable IMPRESSION: Progressive peritoneal carcinomatosis with increased omental caking and new moderate ascites. Slight increases in sizes of hepatic metastases. New LEFT adrenal metastasis. Small RIGHT and minimal LEFT pleural effusions with basilar atelectasis. Nonobstructing RIGHT renal calculus. Electronically Signed   By: Lavonia Dana M.D.   On: 12/06/2019 13:15   US Abdomen Complete  Result Date: 12/07/2019 CLINICAL DATA:  Elevated bilirubin EXAM: ABDOMEN ULTRASOUND COMPLETE COMPARISON:  Abdominal CT from yesterday FINDINGS: Gallbladder: No gallstones or wall thickening visualized. No sonographic Murphy sign noted by sonographer. Common bile duct: Diameter: 4 mm Liver: Heterogeneous liver from known metastatic disease by CT. Portal vein is patent on color Doppler imaging with normal direction of blood flow towards the liver. IVC: No abnormality visualized. Pancreas: Limited assessment Spleen: Size and appearance within normal limits. Right Kidney: Length: 12.3 cm. Echogenicity within normal limits. No mass or hydronephrosis visualized. Left Kidney: Length: 14 cm. Echogenicity within normal limits. No mass or hydronephrosis visualized. Abdominal aorta: No aneurysm visualized. Other findings: Trace ascites. Right pleural effusion. These findings are known from preceding abdominal CT IMPRESSION: No additional finding when compared with CT from yesterday. Diffuse hepatic metastatic disease; no bile duct dilatation. Electronically Signed   By: Monte Fantasia M.D.   On: 12/07/2019 06:52   DG Chest Port 1 View  Result Date: 12/06/2019 CLINICAL DATA:  Hypotension and weakness. History of metastatic colon cancer. EXAM: PORTABLE CHEST 1 VIEW COMPARISON:  10/22/2019  FINDINGS: The left IJ power port is in good position. The cardiac silhouette, mediastinal and hilar contours  are within normal limits and stable. Low lung volumes with vascular crowding and bibasilar atelectasis. There is a small right pleural effusion noted. The bony thorax is intact. IMPRESSION: 1. Low lung volumes with vascular crowding and bibasilar atelectasis. 2. Small right pleural effusion. Electronically Signed   By: Marijo Sanes M.D.   On: 12/06/2019 12:23   [place] CORE BIOPSY (LIVER)  Result Date: 11/10/2019 CLINICAL DATA:  Colon mass.  Multiple liver lesions. EXAM: ULTRASOUND-GUIDED CORE LIVER BIOPSY TECHNIQUE: An ultrasound guided liver biopsy was thoroughly discussed with the patient and questions were answered. The benefits, risks, alternatives, and complications were also discussed. The patient understands and wishes to proceed with the procedure. A verbal as well as written consent was obtained. Survey ultrasound of the liver was performed, representative lesion localized, and an appropriate skin entry site was determined. Skin site was marked, prepped with chlorhexidine, and draped in usual sterile fashion, and infiltrated locally with 1% lidocaine. Intravenous Fentanyl 84mg and Versed 151mwere administered as conscious sedation during continuous monitoring of the patient's level of consciousness and physiological / cardiorespiratory status by the radiology RN, with a total moderate sedation time of 20 minutes. A 17 gauge trocar needle was advanced under ultrasound guidance into the liver to the margin of the lesion. 3 coaxial 18gauge core samples were then obtained through the guide needle. The guide needle was removed. Post procedure scans demonstrate no apparent complication. COMPLICATIONS: COMPLICATIONS None immediate FINDINGS: Ill-defined hepatic lesions were localized corresponding to CT findings. Representative core biopsy samples obtained as above. IMPRESSION: 1. Technically successful ultrasound guided core liver lesion biopsy. Electronically Signed   By: D Lucrezia Europe.D.   On: 11/10/2019 15:17    IR IMAGING GUIDED PORT INSERTION  Result Date: 11/17/2019 INDICATION: [id number] old male with colonic adenocarcinoma metastatic to the liver. He presents for port catheter placement for durable venous access. EXAM: IMPLANTED PORT A CATH PLACEMENT WITH ULTRASOUND AND FLUOROSCOPIC GUIDANCE MEDICATIONS: 2 g Ancef; The antibiotic was administered within an appropriate time interval prior to skin puncture. ANESTHESIA/SEDATION: Versed 4 mg IV; Fentanyl 100 mcg IV; Moderate Sedation Time:  23 minutes The patient was continuously monitored during the procedure by the interventional radiology nurse under my direct supervision. FLUOROSCOPY TIME:  0 minutes, 12 seconds (2 mGy) COMPLICATIONS: None immediate. PROCEDURE: The left neck and chest was prepped with chlorhexidine, and draped in the usual sterile fashion using maximum barrier technique (cap and mask, sterile gown, sterile gloves, large sterile sheet, hand hygiene and cutaneous antiseptic). Local anesthesia was attained by infiltration with 1% lidocaine with epinephrine. Ultrasound demonstrated patency of the left internal jugular vein, and this was documented with an image. Under real-time ultrasound guidance, this vein was accessed with a 21 gauge micropuncture needle and image documentation was performed. A small dermatotomy was made at the access site with an 11 scalpel. A 0.018" wire was advanced into the SVC and the access needle exchanged for a 86F micropuncture vascular sheath. The 0.018" wire was then removed and a 0.035" wire advanced into the IVC. An appropriate location for the subcutaneous reservoir was selected below the clavicle and an incision was made through the skin and underlying soft tissues. The subcutaneous tissues were then dissected using a combination of blunt and sharp surgical technique and a pocket was formed. A single lumen power injectable portacatheter was then tunneled through the subcutaneous tissues from the pocket to  the dermatotomy  and the port reservoir placed within the subcutaneous pocket. The venous access site was then serially dilated and a peel away vascular sheath placed over the wire. The wire was removed and the port catheter advanced into position under fluoroscopic guidance. The catheter tip is positioned in the upper right atrium. This was documented with a spot image. The portacatheter was then tested and found to flush and aspirate well. The port was flushed with saline followed by 100 units/mL heparinized saline. The pocket was then closed in two layers using first subdermal inverted interrupted absorbable sutures followed by a running subcuticular suture. The epidermis was then sealed with Dermabond. The dermatotomy at the venous access site was also closed with Dermabond. IMPRESSION: Successful placement of a left IJ approach Power Port with ultrasound and fluoroscopic guidance. The catheter is ready for use. Electronically Signed   By: Jacqulynn Cadet M.D.   On: 11/17/2019 15:10    ASSESSMENT AND PLAN: 1.  Stage IV colon cancer 2.  AKI 3.  Transaminitis/hyperbilirubinemia 4.  Nausea and vomiting, resolved 5.  Hypotension, improved 6.  Hyponatremia 7.  Anemia  -CT of the abdomen pelvis from admission was reviewed.  This does show some disease progression.  However, he is only had 1 cycle of chemotherapy and this is not enough to consider changing treatment at this time.  We will plan to resume chemotherapy once he recovers from his hospitalization. -Renal function has worsened.  AKI is likely multifactorial and due to nausea, vomiting, decreased oral intake, recent ACE inhibitor use, and possibly oxaliplatin.  Recommend close monitoring.  Consider nephrology consult if not improving. -LFTs and bilirubin are stable today.  Recommend close monitoring.  I do not see any obvious leery obstruction noted on the CT scan. -Continue to hold antihypertensives.  IV fluids per hospitalist. -Hemoglobin drifting downward  and likely due to recent chemotherapy and hemodilution.  Recommend close monitoring.  Transfuse for hemoglobin less than 8.   LOS: 1 day   Mikey Bussing, DNP, AGPCNP-BC, AOCNP 12/07/19   Addendum  I have seen the patient, examined him. I agree with the assessment and and plan and have edited the notes.   I have personally reviewed his recent CT scan from yesterday and abdominal ultrasound from today, and discussed the findings with patient and his sister and father at bedside.  Unfortunately he is metastatic colon cancer appears to be very aggressive, and has progressed rapidly despite one dose chemo about 3 weeks ago.  No indication for biliary stenting or draining. I think most of his symptoms and liver/renal dysfunction are likely related to his diffuse metastatic cancer. If he continues declining, he will unlikely to be a candidate for more chemo. He was seen by palliative care service today, I appreciate their assistance.   Discussed symptom management.  He has started MS Contin 15 mg twice daily, his pain seems to be better controlled.  I again encouraged him to take nutritional supplement, and drink more fluids.  His bowel movement has been normal.  His ascites is not very symptomatic, will hold paracentesis for now.   If his pain is overall controlled by oral meds, and he is able to ambulate safely and drinks adequately, OK to discharge home in 1-2 days. Will set up palliative care home service. I will see him back in a week for f/u, to decide resuming chemo vs transition to hospice.   Truitt Merle  12/07/2019

## 2019-12-08 ENCOUNTER — Inpatient Hospital Stay: Payer: Medicaid Other

## 2019-12-08 LAB — COMPREHENSIVE METABOLIC PANEL
ALT: 34 U/L (ref 0–44)
ALT: 34 U/L (ref 0–44)
AST: 96 U/L — ABNORMAL HIGH (ref 15–41)
AST: 103 U/L — ABNORMAL HIGH (ref 15–41)
Albumin: 2 g/dL — ABNORMAL LOW (ref 3.5–5.0)
Albumin: 2.1 g/dL — ABNORMAL LOW (ref 3.5–5.0)
Alkaline Phosphatase: 328 U/L — ABNORMAL HIGH (ref 38–126)
Alkaline Phosphatase: 333 U/L — ABNORMAL HIGH (ref 38–126)
Anion gap: 15 (ref 5–15)
Anion gap: 15 (ref 5–15)
BUN: 49 mg/dL — ABNORMAL HIGH (ref 6–20)
BUN: 51 mg/dL — ABNORMAL HIGH (ref 6–20)
CO2: 19 mmol/L — ABNORMAL LOW (ref 22–32)
CO2: 20 mmol/L — ABNORMAL LOW (ref 22–32)
Calcium: 8.8 mg/dL — ABNORMAL LOW (ref 8.9–10.3)
Calcium: 8.7 mg/dL — ABNORMAL LOW (ref 8.9–10.3)
Chloride: 93 mmol/L — ABNORMAL LOW (ref 98–111)
Chloride: 93 mmol/L — ABNORMAL LOW (ref 98–111)
Creatinine, Ser: 4.73 mg/dL — ABNORMAL HIGH (ref 0.61–1.24)
Creatinine, Ser: 5.04 mg/dL — ABNORMAL HIGH (ref 0.61–1.24)
GFR calc Af Amer: 15 mL/min — ABNORMAL LOW (ref >60)
GFR calc Af Amer: 14 mL/min — ABNORMAL LOW (ref >60)
GFR calc non Af Amer: 13 mL/min — ABNORMAL LOW (ref >60)
GFR calc non Af Amer: 12 mL/min — ABNORMAL LOW (ref >60)
Glucose, Bld: 96 mg/dL (ref 70–99)
Glucose, Bld: 105 mg/dL — ABNORMAL HIGH (ref 70–99)
Potassium: 5.1 mmol/L (ref 3.5–5.1)
Potassium: 5.3 mmol/L — ABNORMAL HIGH (ref 3.5–5.1)
Sodium: 127 mmol/L — ABNORMAL LOW (ref 135–145)
Sodium: 128 mmol/L — ABNORMAL LOW (ref 135–145)
Total Bilirubin: 3.5 mg/dL — ABNORMAL HIGH (ref 0.3–1.2)
Total Bilirubin: 3.4 mg/dL — ABNORMAL HIGH (ref 0.3–1.2)
Total Protein: 6.6 g/dL (ref 6.5–8.1)
Total Protein: 6.9 g/dL (ref 6.5–8.1)

## 2019-12-08 LAB — CBC
HCT: 30.3 % — ABNORMAL LOW (ref 39.0–52.0)
Hemoglobin: 9.4 g/dL — ABNORMAL LOW (ref 13.0–17.0)
MCH: 26.9 pg (ref 26.0–34.0)
MCHC: 31 g/dL (ref 30.0–36.0)
MCV: 86.6 fL (ref 80.0–100.0)
Platelets: 442 10*3/uL — ABNORMAL HIGH (ref 150–400)
RBC: 3.5 MIL/uL — ABNORMAL LOW (ref 4.22–5.81)
RDW: 18.7 % — ABNORMAL HIGH (ref 11.5–15.5)
WBC: 14.5 10*3/uL — ABNORMAL HIGH (ref 4.0–10.5)
nRBC: 0 % (ref 0.0–0.2)

## 2019-12-08 LAB — TSH: TSH: 7.719 u[IU]/mL — ABNORMAL HIGH (ref 0.350–4.500)

## 2019-12-08 LAB — URINE CULTURE: Culture: NO GROWTH

## 2019-12-08 LAB — CORTISOL-AM, BLOOD: Cortisol - AM: 28.9 ug/dL — ABNORMAL HIGH (ref 6.7–22.6)

## 2019-12-08 LAB — URIC ACID: Uric Acid, Serum: 12.5 mg/dL — ABNORMAL HIGH (ref 3.7–8.6)

## 2019-12-08 MED ORDER — SODIUM CHLORIDE 0.9 % IV SOLN: INTRAVENOUS | Status: DC

## 2019-12-08 NOTE — Progress Notes (Signed)
HEMATOLOGY-ONCOLOGY PROGRESS NOTE  SUBJECTIVE: Vital signs are stable, patient is still very fatigued, sitting in recliner, abdominal pain overall controlled.  Renal function worse today.  Oncology History Overview Note  Cancer Staging No matching staging information was found for the patient.    Metastatic colon cancer to liver ([place])  10/27/2019 Imaging   CT abdomen 10/27/19 at [facility] IMPRESSION: Mass lesion associated with the right hemicolon with numerous hepatic metastatic lesions and extensive peritoneal and mesenteric metastasis. Findings suspicious for primary colonic neoplasm with metastasis.  8 mm left lower lobe pulmonary nodule concerning for potential metastatic lesion.  Bilateral nephrolithiasis without evidence of obstruction.   11/04/2019 Imaging   CT Chest 11/04/19 IMPRESSION: 1. Multiple bilateral pulmonary nodules, consistent with pulmonary metastatic disease. 2. Small right, trace left pleural effusions, presumed malignant. 3. Numerous hypodense lesions of the liver, consistent with hepatic metastatic disease. 4. Trace ascites.   11/10/2019 Initial Biopsy   FINAL MICROSCOPIC DIAGNOSIS: 11/10/19  A. LIVER, RIGHT LOBE, NEEDLE CORE BIOPSY:  - Adenocarcinoma, consistent with colorectal type primary.  - See comment.  COMMENT:  Given the clinical findings, the histology is consistent with the above  diagnosis.  The case was discussed with Dr. [name] on 11/11/2019.  MMR and  Foundation One testing will be performed and the results reported  separately.    11/12/2019 Initial Diagnosis   Metastatic colon cancer to liver ([place])   11/18/2019 -  Chemotherapy   FOLFOX q2weeks starting 11/18/19. Avastin to be added with C2.       REVIEW OF SYSTEMS:   Constitutional: Reports fatigue Respiratory: Denies cough, dyspnea or wheezes Cardiovascular: Denies palpitation, chest discomfort Gastrointestinal: Reports abdominal distention which is unchanged, denies nausea and vomiting,  bowels moving without any difficulty Skin: Denies abnormal skin rashes Lymphatics: Denies new lymphadenopathy or easy bruising Neurological:Denies numbness, tingling or new weaknesses Behavioral/Psych: Mood is stable, no new changes  Extremities: No lower extremity edema All other systems were reviewed with the patient and are negative.  I have reviewed the past medical history, past surgical history, social history and family history with the patient and they are unchanged from previous note.   PHYSICAL EXAMINATION: ECOG PERFORMANCE STATUS: 3  Vitals:   12/08/19 0430 12/08/19 1423  BP: (!) 108/57 93/60  Pulse: 94 91  Resp: 16 18  Temp: (!) 97.4 F (36.3 C) (!) 97.5 F (36.4 C)  SpO2: 94% 94%   Filed Weights   12/07/19 0500 12/07/19 2029 12/08/19 0609  Weight: 203 lb 14.8 oz (92.5 kg) 208 lb 8.9 oz (94.6 kg) 205 lb 7.5 oz (93.2 kg)    Intake/Output from previous day: 06/29 0701 - 06/30 0700 In: 798.9 [P.O.:200; I.V.:548.9; IV Piggyback:50] Out: -   GENERAL: Chronically ill-appearing male, no distress SKIN: skin color, texture, turgor are normal, no rashes or significant lesions EYES: normal, Conjunctiva are pink and non-injected, sclera clear OROPHARYNX:no exudate, no erythema and lips, buccal mucosa, and tongue normal  LUNGS: clear to auscultation and percussion with normal breathing effort HEART: regular rate & rhythm and no murmurs and no lower extremity edema ABDOMEN: Positive bowel sounds, abdomen distended but nontender. NEURO: alert & oriented x 3 with fluent speech, no focal motor/sensory deficits  LABORATORY DATA:  I have reviewed the data as listed CMP Latest Ref Rng & Units 12/08/2019 12/07/2019 12/06/2019  Glucose 70 - 99 mg/dL 96 109(H) 105(H)  BUN 6 - 20 mg/dL 49(H) 35(H) 29(H)  Creatinine 0.61 - 1.24 mg/dL 4.73(H) 3.16(H) 2.51(H)  Sodium 135 -  145 mmol/L 127(L) 126(L) 127(L)  Potassium 3.5 - 5.1 mmol/L 5.1 4.4 4.7  Chloride 98 - 111 mmol/L 93(L) 94(L)  91(L)  CO2 22 - 32 mmol/L 19(L) 25 21(L)  Calcium 8.9 - 10.3 mg/dL 8.8(L) 8.4(L) 9.4  Total Protein 6.5 - 8.1 g/dL 6.6 6.2(L) 7.0  Total Bilirubin 0.3 - 1.2 mg/dL 3.5(H) 3.2(H) 3.2(H)  Alkaline Phos 38 - 126 U/L 328(H) 359(H) 435(H)  AST 15 - 41 U/L 96(H) 106(H) 121(H)  ALT 0 - 44 U/L 34 34 43    Lab Results  Component Value Date   WBC 14.5 (H) 12/08/2019   HGB 9.4 (L) 12/08/2019   HCT 30.3 (L) 12/08/2019   MCV 86.6 12/08/2019   PLT 442 (H) 12/08/2019   NEUTROABS 8.3 (H) 12/06/2019    CT Abdomen Pelvis Wo Contrast  Result Date: 12/06/2019 CLINICAL DATA:  Abdominal distension, abdominal pain greater in lower abdomen, history colon cancer, hypertension EXAM: CT ABDOMEN AND PELVIS WITHOUT CONTRAST TECHNIQUE: Multidetector CT imaging of the abdomen and pelvis was performed following the standard protocol without IV contrast. Sagittal and coronal MPR images reconstructed from axial data set. COMPARISON:  CT abdomen 10/27/2019 FINDINGS: Lower chest: Small RIGHT and minimal LEFT pleural effusions with basilar atelectasis Hepatobiliary: Numerous hepatic mass lesions consistent with widespread hepatic metastatic disease. LEFT lobe lesion 3.9 x 3.0 cm image 16 previously 3.7 x 2.9 cm. Additional lesions appear to have slightly increased in size as well. Gallbladder contracted. Pancreas: Unremarkable Spleen: Normal appearance Adrenals/Urinary Tract: 1.8 x 2.0 cm. Nonobstructing RIGHT renal calculus. No renal mass or hydronephrosis. Ureters and bladder unremarkable. Normal appearance. New LEFT adrenal metastasis Stomach/Bowel: Stomach decompressed. No bowel dilatation or obstruction. Minimal distal colonic diverticulosis. Vascular/Lymphatic: Aorta normal caliber. Periportal adenopathy, largest node 2.2 cm short axis image 34. Reproductive: Prostate gland and seminal vesicles unremarkable Other: Moderate ascites. Progressive peritoneal carcinomatosis and slightly increased omental caking. No free air. No  hernia. Musculoskeletal: Unremarkable IMPRESSION: Progressive peritoneal carcinomatosis with increased omental caking and new moderate ascites. Slight increases in sizes of hepatic metastases. New LEFT adrenal metastasis. Small RIGHT and minimal LEFT pleural effusions with basilar atelectasis. Nonobstructing RIGHT renal calculus. Electronically Signed   By: Lavonia Dana M.D.   On: 12/06/2019 13:15   US Abdomen Complete  Result Date: 12/07/2019 CLINICAL DATA:  Elevated bilirubin EXAM: ABDOMEN ULTRASOUND COMPLETE COMPARISON:  Abdominal CT from yesterday FINDINGS: Gallbladder: No gallstones or wall thickening visualized. No sonographic Murphy sign noted by sonographer. Common bile duct: Diameter: 4 mm Liver: Heterogeneous liver from known metastatic disease by CT. Portal vein is patent on color Doppler imaging with normal direction of blood flow towards the liver. IVC: No abnormality visualized. Pancreas: Limited assessment Spleen: Size and appearance within normal limits. Right Kidney: Length: 12.3 cm. Echogenicity within normal limits. No mass or hydronephrosis visualized. Left Kidney: Length: 14 cm. Echogenicity within normal limits. No mass or hydronephrosis visualized. Abdominal aorta: No aneurysm visualized. Other findings: Trace ascites. Right pleural effusion. These findings are known from preceding abdominal CT IMPRESSION: No additional finding when compared with CT from yesterday. Diffuse hepatic metastatic disease; no bile duct dilatation. Electronically Signed   By: Monte Fantasia M.D.   On: 12/07/2019 06:52   DG Chest Port 1 View  Result Date: 12/06/2019 CLINICAL DATA:  Hypotension and weakness. History of metastatic colon cancer. EXAM: PORTABLE CHEST 1 VIEW COMPARISON:  10/22/2019 FINDINGS: The left IJ power port is in good position. The cardiac silhouette, mediastinal and hilar contours are within normal  limits and stable. Low lung volumes with vascular crowding and bibasilar atelectasis. There  is a small right pleural effusion noted. The bony thorax is intact. IMPRESSION: 1. Low lung volumes with vascular crowding and bibasilar atelectasis. 2. Small right pleural effusion. Electronically Signed   By: Marijo Sanes M.D.   On: 12/06/2019 12:23   [place] CORE BIOPSY (LIVER)  Result Date: 11/10/2019 CLINICAL DATA:  Colon mass.  Multiple liver lesions. EXAM: ULTRASOUND-GUIDED CORE LIVER BIOPSY TECHNIQUE: An ultrasound guided liver biopsy was thoroughly discussed with the patient and questions were answered. The benefits, risks, alternatives, and complications were also discussed. The patient understands and wishes to proceed with the procedure. A verbal as well as written consent was obtained. Survey ultrasound of the liver was performed, representative lesion localized, and an appropriate skin entry site was determined. Skin site was marked, prepped with chlorhexidine, and draped in usual sterile fashion, and infiltrated locally with 1% lidocaine. Intravenous Fentanyl 48mg and Versed 134mwere administered as conscious sedation during continuous monitoring of the patient's level of consciousness and physiological / cardiorespiratory status by the radiology RN, with a total moderate sedation time of 20 minutes. A 17 gauge trocar needle was advanced under ultrasound guidance into the liver to the margin of the lesion. 3 coaxial 18gauge core samples were then obtained through the guide needle. The guide needle was removed. Post procedure scans demonstrate no apparent complication. COMPLICATIONS: COMPLICATIONS None immediate FINDINGS: Ill-defined hepatic lesions were localized corresponding to CT findings. Representative core biopsy samples obtained as above. IMPRESSION: 1. Technically successful ultrasound guided core liver lesion biopsy. Electronically Signed   By: D Lucrezia Europe.D.   On: 11/10/2019 15:17   IR IMAGING GUIDED PORT INSERTION  Result Date: 11/17/2019 INDICATION: [id number] old male with colonic  adenocarcinoma metastatic to the liver. He presents for port catheter placement for durable venous access. EXAM: IMPLANTED PORT A CATH PLACEMENT WITH ULTRASOUND AND FLUOROSCOPIC GUIDANCE MEDICATIONS: 2 g Ancef; The antibiotic was administered within an appropriate time interval prior to skin puncture. ANESTHESIA/SEDATION: Versed 4 mg IV; Fentanyl 100 mcg IV; Moderate Sedation Time:  23 minutes The patient was continuously monitored during the procedure by the interventional radiology nurse under my direct supervision. FLUOROSCOPY TIME:  0 minutes, 12 seconds (2 mGy) COMPLICATIONS: None immediate. PROCEDURE: The left neck and chest was prepped with chlorhexidine, and draped in the usual sterile fashion using maximum barrier technique (cap and mask, sterile gown, sterile gloves, large sterile sheet, hand hygiene and cutaneous antiseptic). Local anesthesia was attained by infiltration with 1% lidocaine with epinephrine. Ultrasound demonstrated patency of the left internal jugular vein, and this was documented with an image. Under real-time ultrasound guidance, this vein was accessed with a 21 gauge micropuncture needle and image documentation was performed. A small dermatotomy was made at the access site with an 11 scalpel. A 0.018" wire was advanced into the SVC and the access needle exchanged for a 52F micropuncture vascular sheath. The 0.018" wire was then removed and a 0.035" wire advanced into the IVC. An appropriate location for the subcutaneous reservoir was selected below the clavicle and an incision was made through the skin and underlying soft tissues. The subcutaneous tissues were then dissected using a combination of blunt and sharp surgical technique and a pocket was formed. A single lumen power injectable portacatheter was then tunneled through the subcutaneous tissues from the pocket to the dermatotomy and the port reservoir placed within the subcutaneous pocket. The venous access site was then serially  dilated  and a peel away vascular sheath placed over the wire. The wire was removed and the port catheter advanced into position under fluoroscopic guidance. The catheter tip is positioned in the upper right atrium. This was documented with a spot image. The portacatheter was then tested and found to flush and aspirate well. The port was flushed with saline followed by 100 units/mL heparinized saline. The pocket was then closed in two layers using first subdermal inverted interrupted absorbable sutures followed by a running subcuticular suture. The epidermis was then sealed with Dermabond. The dermatotomy at the venous access site was also closed with Dermabond. IMPRESSION: Successful placement of a left IJ approach Power Port with ultrasound and fluoroscopic guidance. The catheter is ready for use. Electronically Signed   By: Jacqulynn Cadet M.D.   On: 11/17/2019 15:10    ASSESSMENT AND PLAN: 1.  Stage IV colon cancer 2.  AKI, worsening  3.  Transaminitis/hyperbilirubinemia 4.  Nausea and vomiting, improved  5.  Hypotension, improved 6.  Hyponatremia 7.  Anemia  -Consider consulting nephrology for his AKI, I suspect this could be related to his bulky peritoneal carcinomatosis, although there is no urinary obstruction on the scan. I will check uric acid level today  -LFTs stable  -Lipase overall poor performance status, multiorgan failure, I do not think it is a candidate for more chemotherapy, unless there are reversible causes of condition improves.  -Patient's family would like to bring patient home, his sister is open to hospice. -will f/u tomorrow    LOS: 2 days    Truitt Merle  12/08/2019

## 2019-12-08 NOTE — Progress Notes (Signed)
PROGRESS NOTE    Craig Flowers  TTS:[id number] DOB: June 05, 1966 DOA: 12/06/2019 PCP: Deland Pretty, MD   Brief Narrative: Patient is a 54 year old Caucasian male with a past medical history significant for but not limited to metastatic colon cancer with metastasis to the liver as well as peritoneal metastasis currently undergoing chemotherapy under the direction of Dr. [name], chronic cancer-related pain, chronic nausea and vomiting with decreased appetite related malignancy, hypertension as well as other comorbidities who presented on 11/28/2018 with worsening abdominal pain, increased nausea increasing vomiting and was found to have hypotension.  During his office visit to oncology he also was noted to have an AKI.  He was sent over to the hospital for further management.  CT of the abdomen pelvis showed progressive peritoneal carcinomatosis with increased omental caking.  Moderate ascites was also noted.  Medical Oncology, Palliative Care have been consulted and will run the case by Nephrology. He had nausea this AM but Vomiting has improved. Currently not stable to D/C given worsened Renal Fxn.   Assessment & Plan:   Active Problems:   Metastatic colon cancer to liver (HCC)   AKI (acute kidney injury) ([place])   Cancer related pain   Nausea and vomiting   Hypotension   Hyponatremia   Ascites   Peritoneal carcinomatosis (HCC)   Elevated bilirubin   Normocytic anemia  Nausea/Vomiting/Abdominal Pain likely due to progression of peritoneal carcinomatosis -CT scan shows progressive disease.   -He was found to have elevated bilirubin and now T Bili is 3.5.   -ltrasound of the abdomen does not show any biliary ductal dilatation.  His AST is noted to be elevated. -Alkaline phosphatase was 359 and today is 328.  -His nausea and vomiting appears to have subsided but he did have some this AM.   -There was no evidence of obstruction on the CT scan.   -He tolerated his supper last night.   -His  creatinine continues to worsen.  Continue to monitor.  Acute Kidney Injury, likley prerenal Metabolic Acidosis Likely due to nausea vomiting and decreased oral intake and ? Hypotension -Creatinine was 2.51 at admission.  Increased to 3.16 yesterday and today is 4.73.   -Continue IV Hydration with NS at 100 mL/hr.   -Patient had an acidosis today with a CO2 of 19, anion gap of 15, chloride level of 93 -Monitor urine output and Strict I's and O's.  Baseline creatinine is normal.   -No hydronephrosis noted on ultrasound. -U/A was unremarkable  -Avoid nephrotoxic medications, contrast dyes, hypotension and renally dose medications -Continue to monitor and trend renal function carefully and repeat CMP in a.m. -We discuss with Nephrology for further evaluation and assistance  Hypotension -Likely due to hypovolemia.   -Improved with IV hydration and IVF hydration was stopped for some reason but I resumed it at 100 mL/hr -Continue to Hold his Antihypertensives.  Hypovolemic Hyponatremia -Sodium is 127 this morning.   -Continue normal saline for now as in the AM.   -Recheck labs tomorrow.   -We will also check urine osmolality, TSH, cortisol levels.  Urine sodium and Urine Cr -TSH was 7.719, Cortisol level was 28.9 -Continue to Monitor and Trend -Repeat CMP int the AM  Cancer related pain, acute on chronic -Continue with his MS Contin 15 mg po q12h.   -[name] also on IV morphine  2 mg q3hprn Severe Pain and oral Percocet 1-2 Tab po q4hprn Severe Pain -C/w Methocarbamol 500 mg IV q8hprn Muscle Spasms -C/w Bowel Regimen  Elevated AST  Hyperbilirubinemia  -Question reactive in setting of his peritoneal carcinomatosis and Liver Mets -AST went from 106 down to 96; T Bili is now 3.5 -Continue monitor and trend and if necessary will check an acute hepatitis panel -CT Abd/Pelvis showed "Numerous hepatic mass lesions consistent with widespread hepatic metastatic disease. LEFT lobe lesion  3.9 x 3.0 cm image 16 previously 3.7 x 2.9 cm. Additional lesions appear to have slightly increased in size as well. Gallbladder contracted."  Normocytic Anemia -Likely due to chronic disease.   -No evidence of overt bleeding.   -Mild drop in hemoglobin is likely dilutional. -Patient's Hgb/Hct went from 9.1/28.8 and is now 9.4/30.3 this AM -Check Anemia Panel in the AM -Continue to Monitor for S/Sx of Bleeding; Currently no overt bleeding noted -Repeat CBC in the AM   Mood disorder, stable -Continue Duloxetine 20 mg po Daily .  Muscle Pain -Patient complains of muscle pain diffusely.   -He specifically mentioned pain in both his shoulders.   -Decreased range of motion is noted.   -No abnormal swelling noted in the shoulder or over the humerus.   -He denies any falls.   -He also has discomfort in his thighs.   -We will try muscle relaxant as above.   -Hold off on imaging studies for now.  Patient agreeable to this. -Check ESR and CRP -Because Proximal Muscles are involved ? PMR  -Continue to Monitor Carefully  Leukocytosis -Worsening as WBC went from 11.0 -> 10.6 -> 14.5 and is likely reactive  -Continue to Monitor and Trend Carefully -Currently Afebrile -Oncology Following -Repeat CBC in the AM   Thrombocytosis -Patient's Platelet Count went from 460 -> 403 -> 442 -Continue to Monitor and Trend -Repeat CBC in the AM   GOC: DNR, poA   DVT prophylaxis: Heparin 5,000 sq q8h Code Status: DO NOT RESUSCITATE Family Communication: No family present at bedside  Disposition Plan: We will need further improvement and clinical stability and tolerance of p.o. intake with improvement in his renal function prior to safe discharge disposition  Status is: Inpatient  Remains inpatient appropriate because:Hemodynamically unstable, Ongoing diagnostic testing needed not appropriate for outpatient work up, Unsafe d/c plan, IV treatments appropriate due to intensity of illness or  inability to take PO and Inpatient level of care appropriate due to severity of illness  Dispo:  Patient From: Home  Planned Disposition: Home  Expected discharge date: 12/10/19  Medically stable for discharge: Not medically stable to D/C  Consultants:   Medical Oncology  Palliative Care Medicine  Will run the case by Nephrology   Procedures:    Antimicrobials:  Anti-infectives (From admission, onward)   None     Subjective: Seen and examined at bedside he is sitting in the chair complaining of having nausea earlier this morning but not now.  No chest pain, lightheadedness or dizziness.  States that he still does not feel very well but he states he feels better than this morning.  No lightheadedness or dizziness.  Denies any chest pain.  No other concerns or complaints at this time.  Objective: Vitals:   12/07/19 2029 12/08/19 0047 12/08/19 0430 12/08/19 0609  BP: 95/60 (!) 110/59 (!) 108/57   Pulse: 100 99 94   Resp: 16 16 16    Temp: 98.2 F (36.8 C) 97.7 F (36.5 C) (!) 97.4 F (36.3 C)   TempSrc: Oral Oral Oral   SpO2: 93% 92% 94%   Weight: 94.6 kg   93.2 kg  Height:  Intake/Output Summary (Last 24 hours) at 12/08/2019 1141 Last data filed at 12/08/2019 0500 Gross per 24 hour  Intake 718.93 ml  Output --  Net 718.93 ml   Filed Weights   12/07/19 0500 12/07/19 2029 12/08/19 0609  Weight: 92.5 kg 94.6 kg 93.2 kg   Examination: Physical Exam:  Constitutional: WN/WD overweight Caucasian male currently in NAD and appears calm but does appear uncomfortable Eyes: Lids and conjunctivae normal, sclerae is mildly icteric ENMT: External Ears, Nose appear normal. Grossly normal hearing.  Neck: Appears normal, supple, no cervical masses, normal ROM, no appreciable thyromegaly; no JVD Respiratory: Clear to auscultation bilaterally, no wheezing, rales, rhonchi or crackles. Normal respiratory effort and patient is not tachypenic. No accessory muscle use.  Unlabored  breathing Cardiovascular: RRR, no murmurs / rubs / gallops. S1 and S2 auscultated.  Slight extremity edema.  Abdomen: Somewhat tender, distended secondary to body habitus abdomen feels a little firm. Bowel sounds positive.  GU: Deferred. Musculoskeletal: No clubbing / cyanosis of digits/nails. No joint deformity upper and lower extremities.  Skin: No rashes, lesions, ulcers on limited skin evaluation. No induration; Warm and dry.  Neurologic: CN 2-12 grossly intact with no focal deficits.  Romberg sign and cerebellar reflexes not assessed.  Psychiatric: Normal judgment and insight. Alert and oriented x 3. Normal mood and appropriate affect.   Data Reviewed: I have personally reviewed following labs and imaging studies  CBC: Recent Labs  Lab 12/06/19 0915 12/07/19 0345 12/08/19 0500  WBC 11.0* 10.6* 14.5*  NEUTROABS 8.3*  --   --   HGB 10.2* 9.1* 9.4*  HCT 32.0* 28.8* 30.3*  MCV 84.2 84.7 86.6  PLT 460* 403* 923*   Basic Metabolic Panel: Recent Labs  Lab 12/06/19 0915 12/07/19 0345 12/08/19 0626  NA 127* 126* 127*  K 4.7 4.4 5.1  CL 91* 94* 93*  CO2 21* 25 19*  GLUCOSE 105* 109* 96  BUN 29* 35* 49*  CREATININE 2.51* 3.16* 4.73*  CALCIUM 9.4 8.4* 8.8*   GFR: Estimated Creatinine Clearance: 19.9 mL/min (A) (by C-G formula based on SCr of 4.73 mg/dL (H)). Liver Function Tests: Recent Labs  Lab 12/06/19 0915 12/07/19 0345 12/08/19 0626  AST 121* 106* 96*  ALT 43 34 34  ALKPHOS 435* 359* 328*  BILITOT 3.2* 3.2* 3.5*  PROT 7.0 6.2* 6.6  ALBUMIN 2.0* 2.0* 2.0*   No results for input(s): LIPASE, AMYLASE in the last 168 hours. No results for input(s): AMMONIA in the last 168 hours. Coagulation Profile: Recent Labs  Lab 12/06/19 1142  INR 1.0   Cardiac Enzymes: No results for input(s): CKTOTAL, CKMB, CKMBINDEX, TROPONINI in the last 168 hours. BNP (last 3 results) No results for input(s): PROBNP in the last 8760 hours. HbA1C: No results for input(s): HGBA1C in  the last 72 hours. CBG: No results for input(s): GLUCAP in the last 168 hours. Lipid Profile: No results for input(s): CHOL, HDL, LDLCALC, TRIG, CHOLHDL, LDLDIRECT in the last 72 hours. Thyroid Function Tests: Recent Labs    12/08/19 0626  TSH 7.719*   Anemia Panel: No results for input(s): VITAMINB12, FOLATE, FERRITIN, TIBC, IRON, RETICCTPCT in the last 72 hours. Sepsis Labs: Recent Labs  Lab 12/06/19 1142  LATICACIDVEN 1.9    Recent Results (from the past 240 hour(s))  Culture, blood (routine x 2)     Status: None (Preliminary result)   Collection Time: 12/06/19 11:42 AM   Specimen: BLOOD  Result Value Ref Range Status   Specimen Description  Final    BLOOD PORTA CATH Performed at [facility], [place] [address] [place], [place] [address]    Special Requests   Final    BOTTLES DRAWN AEROBIC AND ANAEROBIC Blood Culture adequate volume Performed at [facility] [address], [place], [place] [address]    Culture   Final    NO GROWTH 2 DAYS Performed at [place] [facility] [address], [place], [place] [address]    Report Status PENDING  Incomplete  Culture, blood (routine x 2)     Status: None (Preliminary result)   Collection Time: 12/06/19 11:42 AM   Specimen: BLOOD  Result Value Ref Range Status   Specimen Description   Final    BLOOD LEFT ANTECUBITAL Performed at [facility] [address] [place], [place] [address]    Special Requests   Final    BOTTLES DRAWN AEROBIC AND ANAEROBIC Blood Culture adequate volume Performed at [facility] [address], [place], [place] [address]    Culture   Final    NO GROWTH 2 DAYS Performed at [facility] [address], [place], [place] [address]    Report Status PENDING  Incomplete  SARS Coronavirus 2 by RT PCR (hospital order, performed in [facility] hospital lab) Nasopharyngeal Nasopharyngeal Swab     Status: None    Collection Time: 12/06/19 11:42 AM   Specimen: Nasopharyngeal Swab  Result Value Ref Range Status   SARS Coronavirus 2 NEGATIVE NEGATIVE Final    Comment: (NOTE) SARS-CoV-2 target nucleic acids are NOT DETECTED.  The SARS-CoV-2 RNA is generally detectable in upper and lower respiratory specimens during the acute phase of infection. The lowest concentration of SARS-CoV-2 viral copies this assay can detect is 250 copies / mL. A negative result does not preclude SARS-CoV-2 infection and should not be used as the sole basis for treatment or other patient management decisions.  A negative result may occur with improper specimen collection / handling, submission of specimen other than nasopharyngeal swab, presence of viral mutation(s) within the areas targeted by this assay, and inadequate number of viral copies (<250 copies / mL). A negative result must be combined with clinical observations, patient history, and epidemiological information.  Fact Sheet for Patients:   StrictlyIdeas.no  Fact Sheet for Healthcare Providers: BankingDealers.co.za  This test is not yet approved or  cleared by the [place] FDA and has been authorized for detection and/or diagnosis of SARS-CoV-2 by FDA under an Emergency Use Authorization (EUA).  This EUA will remain in effect (meaning this test can be used) for the duration of the COVID-19 declaration under Section 564(b)(1) of the Act, 21 U.S.C. section 360bbb-3(b)(1), unless the authorization is terminated or revoked sooner.  Performed at [facility], [place] [address], [place], [place] [address]   MRSA PCR Screening     Status: None   Collection Time: 12/06/19  3:35 PM   Specimen: Nasopharyngeal  Result Value Ref Range Status   MRSA by PCR NEGATIVE NEGATIVE Final    Comment:        The GeneXpert MRSA Assay (FDA approved for NASAL specimens only), is one component of  a comprehensive MRSA colonization surveillance program. It is not intended to diagnose MRSA infection nor to guide or monitor treatment for MRSA infections. Performed at [facility], [place] [address], [place],  [address]   Urine culture     Status: None  Collection Time: 12/06/19  6:49 PM   Specimen: Urine, Random  Result Value Ref Range Status   Specimen Description   Final    URINE, RANDOM Performed at [facility] [address], [place], [place] [address]    Special Requests   Final    NONE Performed at [facility], [place] [address] [place], [place] [address]    Culture   Final    NO GROWTH Performed at [facility], [place] [address], [place], [place] [address]    Report Status 12/08/2019 FINAL  Final     RN Pressure Injury Documentation:     Estimated body mass index is 27.11 kg/m as calculated from the following:   Height as of this encounter: 6' 1"  (1.854 m).   Weight as of this encounter: 93.2 kg.  Malnutrition Type:  Nutrition Problem: Increased nutrient needs Etiology: cancer and cancer related treatments   Malnutrition Characteristics:  Signs/Symptoms: estimated needs   Nutrition Interventions:  Interventions: Ensure Enlive (each supplement provides 350kcal and 20 grams of protein), MVI, Magic cup   Radiology Studies: CT Abdomen Pelvis Wo Contrast  Result Date: 12/06/2019 CLINICAL DATA:  Abdominal distension, abdominal pain greater in lower abdomen, history colon cancer, hypertension EXAM: CT ABDOMEN AND PELVIS WITHOUT CONTRAST TECHNIQUE: Multidetector CT imaging of the abdomen and pelvis was performed following the standard protocol without IV contrast. Sagittal and coronal MPR images reconstructed from axial data set. COMPARISON:  CT abdomen 10/27/2019 FINDINGS: Lower chest: Small RIGHT and minimal LEFT pleural effusions with basilar atelectasis Hepatobiliary: Numerous hepatic mass  lesions consistent with widespread hepatic metastatic disease. LEFT lobe lesion 3.9 x 3.0 cm image 16 previously 3.7 x 2.9 cm. Additional lesions appear to have slightly increased in size as well. Gallbladder contracted. Pancreas: Unremarkable Spleen: Normal appearance Adrenals/Urinary Tract: 1.8 x 2.0 cm. Nonobstructing RIGHT renal calculus. No renal mass or hydronephrosis. Ureters and bladder unremarkable. Normal appearance. New LEFT adrenal metastasis Stomach/Bowel: Stomach decompressed. No bowel dilatation or obstruction. Minimal distal colonic diverticulosis. Vascular/Lymphatic: Aorta normal caliber. Periportal adenopathy, largest node 2.2 cm short axis image 34. Reproductive: Prostate gland and seminal vesicles unremarkable Other: Moderate ascites. Progressive peritoneal carcinomatosis and slightly increased omental caking. No free air. No hernia. Musculoskeletal: Unremarkable IMPRESSION: Progressive peritoneal carcinomatosis with increased omental caking and new moderate ascites. Slight increases in sizes of hepatic metastases. New LEFT adrenal metastasis. Small RIGHT and minimal LEFT pleural effusions with basilar atelectasis. Nonobstructing RIGHT renal calculus. Electronically Signed   By: Lavonia [place] M.D.   On: 12/06/2019 13:15   US Abdomen Complete  Result Date: 12/07/2019 CLINICAL DATA:  Elevated bilirubin EXAM: ABDOMEN ULTRASOUND COMPLETE COMPARISON:  Abdominal CT from yesterday FINDINGS: Gallbladder: No gallstones or wall thickening visualized. No sonographic Murphy sign noted by sonographer. Common bile duct: Diameter: 4 mm Liver: Heterogeneous liver from known metastatic disease by CT. Portal vein is patent on color Doppler imaging with normal direction of blood flow towards the liver. IVC: No abnormality visualized. Pancreas: Limited assessment Spleen: Size and appearance within normal limits. Right Kidney: Length: 12.3 cm. Echogenicity within normal limits. No mass or hydronephrosis visualized.  Left Kidney: Length: 14 cm. Echogenicity within normal limits. No mass or hydronephrosis visualized. Abdominal aorta: No aneurysm visualized. Other findings: Trace ascites. Right pleural effusion. These findings are known from preceding abdominal CT IMPRESSION: No additional finding when compared with CT from yesterday. Diffuse hepatic metastatic disease; no bile duct dilatation. Electronically Signed   By: Monte Fantasia  M.D.   On: 12/07/2019 06:52   DG Chest Port 1 View  Result Date: 12/06/2019 CLINICAL DATA:  Hypotension and weakness. History of metastatic colon cancer. EXAM: PORTABLE CHEST 1 VIEW COMPARISON:  10/22/2019 FINDINGS: The left IJ power port is in good position. The cardiac silhouette, mediastinal and hilar contours are within normal limits and stable. Low lung volumes with vascular crowding and bibasilar atelectasis. There is a small right pleural effusion noted. The bony thorax is intact. IMPRESSION: 1. Low lung volumes with vascular crowding and bibasilar atelectasis. 2. Small right pleural effusion. Electronically Signed   By: Marijo Sanes M.D.   On: 12/06/2019 12:23   Scheduled Meds: . Chlorhexidine Gluconate Cloth  6 each Topical Daily  . DULoxetine  20 mg Oral Daily  . feeding supplement (ENSURE ENLIVE)  237 mL Oral TID BM  . heparin  5,000 Units Subcutaneous Q8H  . mouth rinse  15 mL Mouth Rinse BID  . morphine  15 mg Oral Q12H  . multivitamin with minerals  1 tablet Oral Daily  . sodium chloride flush  10-40 mL Intracatheter Q12H   Continuous Infusions: . methocarbamol (ROBAXIN) IV Stopped (12/07/19 1843)    LOS: 2 days   Kerney Elbe, DO [facility] PAGER is on [place]  If 7PM-7AM, please contact night-coverage [email or web address]

## 2019-12-08 NOTE — Progress Notes (Signed)
°   12/08/19 0900  Clinical Encounter Type  Visited With Patient  Visit Type Initial;Psychological support;Spiritual support  Referral From Palliative care team  Consult/Referral To Chaplain  Spiritual Encounters  Spiritual Needs Emotional;Brochure;Other (Comment) (Advance Directive )  Stress Factors  Patient Stress Factors Health changes;Major life changes;Other (Comment)  Advance Directives (For Healthcare)  Does Patient Have a Medical Advance Directive? No  Would patient like information on creating a medical advance directive? No - Patient declined  [place]  Does Patient Have a Mental Health Advance Directive? No  Would patient like information on creating a mental health advance directive? No - Patient declined   I spoke with Craig Flowers per spiritual care consult to discuss an Advance Directive. Craig Flowers was unsure about what an Advance directive was and I provided education on the document. Craig Flowers states that he does not want to complete one at this point, but will discuss this with his sister. Craig Flowers did say that if he ever was unable to make his own healthcare decisions that he would like for his sister to make those decisions for him.   Please, contact Spiritual Care for further assistance.   Chaplain Shanon Ace M.Div., [facility]

## 2019-12-08 NOTE — TOC Initial Note (Signed)
Transition of Care [facility]) - Initial/Assessment Note    Patient Details  Name: Craig Flowers MRN: [id number] Date of Birth: April 30, 1966  Transition of [facility]) CM/SW Contact:    Lynnell Catalan, RN Phone Number: 12/08/2019, 9:58 AM  Clinical Narrative:                 Pt from home alone. [name] independent with ADLs at baseline. His sister and father live very close by and are able to help with whatever is needed. Per Dr. Ernestina Penna note she is setting up pt for outpatient palliative services. He may need MATCH letter at [employer] as he does not have insurance. TOC will follow along and assist with DC as needed.  Expected Discharge Plan: Home/Self Care Barriers to Discharge: Continued Medical Work up Expected Discharge Plan and Services Expected Discharge Plan: Home/Self Care   Discharge Planning Services: CM Consult   Living arrangements for the past 2 months: Single Family Home                   Prior Living Arrangements/Services Living arrangements for the past 2 months: Single Family Home Lives with:: Self          Need for Family Participation in Patient Care: Yes (Comment) Care giver support system in place?: Yes (comment)      Activities of Daily Living Home Assistive Devices/Equipment: None ADL Screening (condition at time of admission) Patient's cognitive ability adequate to safely complete daily activities?: Yes Is the patient deaf or have difficulty hearing?: No Does the patient have difficulty seeing, even when wearing glasses/contacts?: No Does the patient have difficulty concentrating, remembering, or making decisions?: No Patient able to express need for assistance with ADLs?: Yes Does the patient have difficulty dressing or bathing?: No Independently performs ADLs?: Yes (appropriate for developmental age) Does the patient have difficulty walking or climbing stairs?: No Weakness of Legs: Both Weakness of Arms/Hands: Both      Emotional Assessment        Orientation: : Oriented to Self, Oriented to Place, Oriented to  Time, Oriented to Situation      Admission diagnosis:  Malignant ascites [R18.0] AKI (acute kidney injury) ([place]) [N17.9] Elevated bilirubin [R17] Intractable nausea and vomiting [R11.2] Metastatic colon cancer to liver ([place]) [C18.9, C78.7] Hypotension [I95.9] Hypotension, unspecified hypotension type [I95.9] Patient Active Problem List   Diagnosis Date Noted  . Hypotension 12/06/2019  . Hyponatremia 12/06/2019  . Ascites 12/06/2019  . Peritoneal carcinomatosis ([place]) 12/06/2019  . Elevated bilirubin 12/06/2019  . Normocytic anemia 12/06/2019  . Mucositis 11/26/2019  . Cancer related pain 11/26/2019  . Nausea and vomiting 11/26/2019  . AKI (acute kidney injury) ([place]) 11/18/2019  . Metastatic colon cancer to liver ([place]) 11/12/2019  . Goals of care, counseling/discussion 10/29/2019   PCP:  Deland Pretty, MD Pharmacy:   [place] [place] [address] Phone: [phone] Fax: [phone]     Social Determinants of Health (SDOH) Interventions    Readmission Risk Interventions Readmission Risk Prevention Plan 12/08/2019  Transportation Screening Complete  PCP or Specialist Appt within 3-5 Days Complete  HRI or [place] Complete  Social Work Consult for [place] Planning/Counseling Complete  Palliative Care Screening Complete  Medication Review [employer]) Complete

## 2019-12-09 DIAGNOSIS — R112 Nausea with vomiting, unspecified: Secondary | ICD-10-CM

## 2019-12-09 LAB — CBC WITH DIFFERENTIAL/PLATELET
Abs Immature Granulocytes: 0.86 10*3/uL — ABNORMAL HIGH (ref 0.00–0.07)
Basophils Absolute: 0.1 10*3/uL (ref 0.0–0.1)
Basophils Relative: 0 %
Eosinophils Absolute: 0.1 10*3/uL (ref 0.0–0.5)
Eosinophils Relative: 0 %
HCT: 28.9 % — ABNORMAL LOW (ref 39.0–52.0)
Hemoglobin: 9 g/dL — ABNORMAL LOW (ref 13.0–17.0)
Immature Granulocytes: 4 %
Lymphocytes Relative: 2 %
Lymphs Abs: 0.4 10*3/uL — ABNORMAL LOW (ref 0.7–4.0)
MCH: 26.9 pg (ref 26.0–34.0)
MCHC: 31.1 g/dL (ref 30.0–36.0)
MCV: 86.5 fL (ref 80.0–100.0)
Monocytes Absolute: 1.1 10*3/uL — ABNORMAL HIGH (ref 0.1–1.0)
Monocytes Relative: 5 %
Neutro Abs: 17.1 10*3/uL — ABNORMAL HIGH (ref 1.7–7.7)
Neutrophils Relative %: 89 %
Platelets: 409 10*3/uL — ABNORMAL HIGH (ref 150–400)
RBC: 3.34 MIL/uL — ABNORMAL LOW (ref 4.22–5.81)
RDW: 18.7 % — ABNORMAL HIGH (ref 11.5–15.5)
WBC: 19.6 10*3/uL — ABNORMAL HIGH (ref 4.0–10.5)
nRBC: 0 % (ref 0.0–0.2)

## 2019-12-09 LAB — COMPREHENSIVE METABOLIC PANEL
ALT: 36 U/L (ref 0–44)
AST: 130 U/L — ABNORMAL HIGH (ref 15–41)
Albumin: 2.2 g/dL — ABNORMAL LOW (ref 3.5–5.0)
Alkaline Phosphatase: 306 U/L — ABNORMAL HIGH (ref 38–126)
Anion gap: 15 (ref 5–15)
BUN: 57 mg/dL — ABNORMAL HIGH (ref 6–20)
CO2: 17 mmol/L — ABNORMAL LOW (ref 22–32)
Calcium: 8.7 mg/dL — ABNORMAL LOW (ref 8.9–10.3)
Chloride: 94 mmol/L — ABNORMAL LOW (ref 98–111)
Creatinine, Ser: 5.46 mg/dL — ABNORMAL HIGH (ref 0.61–1.24)
GFR calc Af Amer: 13 mL/min — ABNORMAL LOW (ref >60)
GFR calc non Af Amer: 11 mL/min — ABNORMAL LOW (ref >60)
Glucose, Bld: 80 mg/dL (ref 70–99)
Potassium: 5.9 mmol/L — ABNORMAL HIGH (ref 3.5–5.1)
Sodium: 126 mmol/L — ABNORMAL LOW (ref 135–145)
Total Bilirubin: 3.1 mg/dL — ABNORMAL HIGH (ref 0.3–1.2)
Total Protein: 6.6 g/dL (ref 6.5–8.1)

## 2019-12-09 LAB — RETICULOCYTES
Immature Retic Fract: 24.3 % — ABNORMAL HIGH (ref 2.3–15.9)
RBC.: 3.36 MIL/uL — ABNORMAL LOW (ref 4.22–5.81)
Retic Count, Absolute: 95.4 10*3/uL (ref 19.0–186.0)
Retic Ct Pct: 2.8 % (ref 0.4–3.1)

## 2019-12-09 LAB — IRON AND TIBC
Iron: 19 ug/dL — ABNORMAL LOW (ref 45–182)
Saturation Ratios: 11 % — ABNORMAL LOW (ref 17.9–39.5)
TIBC: 178 ug/dL — ABNORMAL LOW (ref 250–450)
UIBC: 159 ug/dL

## 2019-12-09 LAB — MAGNESIUM: Magnesium: 2.7 mg/dL — ABNORMAL HIGH (ref 1.7–2.4)

## 2019-12-09 LAB — PHOSPHORUS: Phosphorus: 7.7 mg/dL — ABNORMAL HIGH (ref 2.5–4.6)

## 2019-12-09 LAB — VITAMIN B12: Vitamin B-12: 777 pg/mL (ref 180–914)

## 2019-12-09 LAB — C-REACTIVE PROTEIN: CRP: 37.6 mg/dL — ABNORMAL HIGH (ref <1.0)

## 2019-12-09 LAB — SEDIMENTATION RATE: Sed Rate: 94 mm/hr — ABNORMAL HIGH (ref 0–16)

## 2019-12-09 LAB — T4, FREE: Free T4: 1.17 ng/dL — ABNORMAL HIGH (ref 0.61–1.12)

## 2019-12-09 LAB — FOLATE: Folate: 12.5 ng/mL (ref >5.9)

## 2019-12-09 LAB — FERRITIN: Ferritin: 2290 ng/mL — ABNORMAL HIGH (ref 24–336)

## 2019-12-09 MED ORDER — HYDROMORPHONE HCL 1 MG/ML IJ SOLN: 0.5000 mg | INTRAMUSCULAR | Status: DC | PRN

## 2019-12-09 MED ORDER — SODIUM CHLORIDE 0.9 % IV BOLUS: 500.0000 mL | Freq: Once | INTRAVENOUS | Status: DC

## 2019-12-09 MED ORDER — MORPHINE SULFATE ER 15 MG PO TBCR: 15.0000 mg | EXTENDED_RELEASE_TABLET | Freq: Two times a day (BID) | ORAL | 0 refills | Status: DC

## 2019-12-09 MED ORDER — ENSURE ENLIVE PO LIQD: 237.0000 mL | Freq: Three times a day (TID) | ORAL | 12 refills | Status: AC

## 2019-12-09 MED ORDER — OXYCODONE HCL ER 10 MG PO T12A: 10.0000 mg | EXTENDED_RELEASE_TABLET | Freq: Two times a day (BID) | ORAL | Status: DC

## 2019-12-09 MED ORDER — HEPARIN SOD (PORK) LOCK FLUSH 100 UNIT/ML IV SOLN
500.0000 [IU] | Freq: Once | INTRAVENOUS | Status: AC
  Administered 2019-12-09: 500 [IU] via INTRAVENOUS
  Filled 2019-12-09: qty 5

## 2019-12-09 MED ORDER — SODIUM ZIRCONIUM CYCLOSILICATE 10 G PO PACK
10.0000 g | PACK | Freq: Once | ORAL | Status: AC
  Administered 2019-12-09: 10 g via ORAL
  Filled 2019-12-09: qty 1

## 2019-12-09 MED ORDER — OXYCODONE HCL 5 MG PO TABS: 5.0000 mg | ORAL_TABLET | Freq: Four times a day (QID) | ORAL | 0 refills | Status: AC | PRN

## 2019-12-09 MED ORDER — ONDANSETRON 8 MG PO TBDP: 8.0000 mg | ORAL_TABLET | Freq: Three times a day (TID) | ORAL | 0 refills | Status: AC | PRN

## 2019-12-09 MED ORDER — SENNOSIDES-DOCUSATE SODIUM 8.6-50 MG PO TABS: 1.0000 | ORAL_TABLET | Freq: Every evening | ORAL | 0 refills | Status: AC | PRN

## 2019-12-09 MED ORDER — MUSCLE RUB 10-15 % EX CREA: 1.0000 "application " | TOPICAL_CREAM | CUTANEOUS | 0 refills | Status: AC | PRN

## 2019-12-09 MED FILL — ONDANSETRON ODT 8 MG TABLET: 8 | 10 days supply | Qty: 30 | Fill #0

## 2019-12-09 MED FILL — oxyCODONE HCL 5 MG TABS: 5 | 2 days supply | Qty: 10 | Fill #0

## 2019-12-09 NOTE — Progress Notes (Addendum)
HEMATOLOGY-ONCOLOGY PROGRESS NOTE  SUBJECTIVE: Somewhat somnolent this morning.  [name] up to answer questions but falls asleep quickly.  Reports abdominal discomfort but no nausea or vomiting this morning.  Very little urine output recorded-not clear if accurate.  Renal function continues to worsen.  Oncology History Overview Note  Cancer Staging No matching staging information was found for the patient.    Metastatic colon cancer to liver ([place])  10/27/2019 Imaging   CT abdomen 10/27/19 at [facility] IMPRESSION: Mass lesion associated with the right hemicolon with numerous hepatic metastatic lesions and extensive peritoneal and mesenteric metastasis. Findings suspicious for primary colonic neoplasm with metastasis.  8 mm left lower lobe pulmonary nodule concerning for potential metastatic lesion.  Bilateral nephrolithiasis without evidence of obstruction.   11/04/2019 Imaging   CT Chest 11/04/19 IMPRESSION: 1. Multiple bilateral pulmonary nodules, consistent with pulmonary metastatic disease. 2. Small right, trace left pleural effusions, presumed malignant. 3. Numerous hypodense lesions of the liver, consistent with hepatic metastatic disease. 4. Trace ascites.   11/10/2019 Initial Biopsy   FINAL MICROSCOPIC DIAGNOSIS: 11/10/19  A. LIVER, RIGHT LOBE, NEEDLE CORE BIOPSY:  - Adenocarcinoma, consistent with colorectal type primary.  - See comment.  COMMENT:  Given the clinical findings, the histology is consistent with the above  diagnosis.  The case was discussed with Dr. [name] on 11/11/2019.  MMR and  Foundation One testing will be performed and the results reported  separately.    11/12/2019 Initial Diagnosis   Metastatic colon cancer to liver ([place])   11/18/2019 -  Chemotherapy   FOLFOX q2weeks starting 11/18/19. Avastin to be added with C2.       REVIEW OF SYSTEMS:   Constitutional: Reports generalized fatigue and weakness Respiratory: Denies cough, dyspnea or  wheezes Cardiovascular: Denies palpitation, chest discomfort Gastrointestinal: Reports abdominal distention and discomfort, denies nausea and vomiting Skin: Denies abnormal skin rashes Lymphatics: Denies new lymphadenopathy or easy bruising Neurological:Denies numbness, tingling or new weaknesses Behavioral/Psych: Mood is stable, no new changes  Extremities: No lower extremity edema All other systems were reviewed with the patient and are negative.  I have reviewed the past medical history, past surgical history, social history and family history with the patient and they are unchanged from previous note.   PHYSICAL EXAMINATION: ECOG PERFORMANCE STATUS: 3  Vitals:   12/08/19 2200 12/09/19 0546  BP: 109/63 (!) 96/57  Pulse: 97 (!) 112  Resp: 17 17  Temp: 97.9 F (36.6 C) 98.6 F (37 C)  SpO2: 91% 90%   Filed Weights   12/07/19 2029 12/08/19 0609 12/09/19 0546  Weight: 94.6 kg 93.2 kg 95.1 kg    Intake/Output from previous day: 06/30 0701 - 07/01 0700 In: 1816.2 [P.O.:240; I.V.:1576.2] Out: 200 [Urine:200]  GENERAL: Chronically ill-appearing male, no distress SKIN: skin color, texture, turgor are normal, no rashes or significant lesions EYES: normal, Conjunctiva are pink and non-injected, sclera clear OROPHARYNX:no exudate, no erythema and lips, buccal mucosa, and tongue normal  LUNGS: clear to auscultation and percussion with normal breathing effort HEART: regular rate & rhythm and no murmurs and no lower extremity edema ABDOMEN: Positive bowel sounds, abdomen distended  NEURO: Somnolent-can answer questions but falls asleep quickly  LABORATORY DATA:  I have reviewed the data as listed CMP Latest Ref Rng & Units 12/09/2019 12/08/2019 12/08/2019  Glucose 70 - 99 mg/dL 80 105(H) 96  BUN 6 - 20 mg/dL 57(H) 51(H) 49(H)  Creatinine 0.61 - 1.24 mg/dL 5.46(H) 5.04(H) 4.73(H)  Sodium 135 - 145 mmol/L 126(L)  128(L) 127(L)  Potassium 3.5 - 5.1 mmol/L 5.9(H) 5.3(H) 5.1  Chloride  98 - 111 mmol/L 94(L) 93(L) 93(L)  CO2 22 - 32 mmol/L 17(L) 20(L) 19(L)  Calcium 8.9 - 10.3 mg/dL 8.7(L) 8.7(L) 8.8(L)  Total Protein 6.5 - 8.1 g/dL 6.6 6.9 6.6  Total Bilirubin 0.3 - 1.2 mg/dL 3.1(H) 3.4(H) 3.5(H)  Alkaline Phos 38 - 126 U/L 306(H) 333(H) 328(H)  AST 15 - 41 U/L 130(H) 103(H) 96(H)  ALT 0 - 44 U/L 36 34 34    Lab Results  Component Value Date   WBC 19.6 (H) 12/09/2019   HGB 9.0 (L) 12/09/2019   HCT 28.9 (L) 12/09/2019   MCV 86.5 12/09/2019   PLT 409 (H) 12/09/2019   NEUTROABS 17.1 (H) 12/09/2019    CT Abdomen Pelvis Wo Contrast  Result Date: 12/06/2019 CLINICAL DATA:  Abdominal distension, abdominal pain greater in lower abdomen, history colon cancer, hypertension EXAM: CT ABDOMEN AND PELVIS WITHOUT CONTRAST TECHNIQUE: Multidetector CT imaging of the abdomen and pelvis was performed following the standard protocol without IV contrast. Sagittal and coronal MPR images reconstructed from axial data set. COMPARISON:  CT abdomen 10/27/2019 FINDINGS: Lower chest: Small RIGHT and minimal LEFT pleural effusions with basilar atelectasis Hepatobiliary: Numerous hepatic mass lesions consistent with widespread hepatic metastatic disease. LEFT lobe lesion 3.9 x 3.0 cm image 16 previously 3.7 x 2.9 cm. Additional lesions appear to have slightly increased in size as well. Gallbladder contracted. Pancreas: Unremarkable Spleen: Normal appearance Adrenals/Urinary Tract: 1.8 x 2.0 cm. Nonobstructing RIGHT renal calculus. No renal mass or hydronephrosis. Ureters and bladder unremarkable. Normal appearance. New LEFT adrenal metastasis Stomach/Bowel: Stomach decompressed. No bowel dilatation or obstruction. Minimal distal colonic diverticulosis. Vascular/Lymphatic: Aorta normal caliber. Periportal adenopathy, largest node 2.2 cm short axis image 34. Reproductive: Prostate gland and seminal vesicles unremarkable Other: Moderate ascites. Progressive peritoneal carcinomatosis and slightly increased  omental caking. No free air. No hernia. Musculoskeletal: Unremarkable IMPRESSION: Progressive peritoneal carcinomatosis with increased omental caking and new moderate ascites. Slight increases in sizes of hepatic metastases. New LEFT adrenal metastasis. Small RIGHT and minimal LEFT pleural effusions with basilar atelectasis. Nonobstructing RIGHT renal calculus. Electronically Signed   By: Lavonia Dana M.D.   On: 12/06/2019 13:15   US Abdomen Complete  Result Date: 12/07/2019 CLINICAL DATA:  Elevated bilirubin EXAM: ABDOMEN ULTRASOUND COMPLETE COMPARISON:  Abdominal CT from yesterday FINDINGS: Gallbladder: No gallstones or wall thickening visualized. No sonographic Murphy sign noted by sonographer. Common bile duct: Diameter: 4 mm Liver: Heterogeneous liver from known metastatic disease by CT. Portal vein is patent on color Doppler imaging with normal direction of blood flow towards the liver. IVC: No abnormality visualized. Pancreas: Limited assessment Spleen: Size and appearance within normal limits. Right Kidney: Length: 12.3 cm. Echogenicity within normal limits. No mass or hydronephrosis visualized. Left Kidney: Length: 14 cm. Echogenicity within normal limits. No mass or hydronephrosis visualized. Abdominal aorta: No aneurysm visualized. Other findings: Trace ascites. Right pleural effusion. These findings are known from preceding abdominal CT IMPRESSION: No additional finding when compared with CT from yesterday. Diffuse hepatic metastatic disease; no bile duct dilatation. Electronically Signed   By: Monte Fantasia M.D.   On: 12/07/2019 06:52   DG Chest Port 1 View  Result Date: 12/06/2019 CLINICAL DATA:  Hypotension and weakness. History of metastatic colon cancer. EXAM: PORTABLE CHEST 1 VIEW COMPARISON:  10/22/2019 FINDINGS: The left IJ power port is in good position. The cardiac silhouette, mediastinal and hilar contours are within normal limits and stable.  Low lung volumes with vascular crowding  and bibasilar atelectasis. There is a small right pleural effusion noted. The bony thorax is intact. IMPRESSION: 1. Low lung volumes with vascular crowding and bibasilar atelectasis. 2. Small right pleural effusion. Electronically Signed   By: Marijo Sanes M.D.   On: 12/06/2019 12:23   [place] CORE BIOPSY (LIVER)  Result Date: 11/10/2019 CLINICAL DATA:  Colon mass.  Multiple liver lesions. EXAM: ULTRASOUND-GUIDED CORE LIVER BIOPSY TECHNIQUE: An ultrasound guided liver biopsy was thoroughly discussed with the patient and questions were answered. The benefits, risks, alternatives, and complications were also discussed. The patient understands and wishes to proceed with the procedure. A verbal as well as written consent was obtained. Survey ultrasound of the liver was performed, representative lesion localized, and an appropriate skin entry site was determined. Skin site was marked, prepped with chlorhexidine, and draped in usual sterile fashion, and infiltrated locally with 1% lidocaine. Intravenous Fentanyl 67mg and Versed 122mwere administered as conscious sedation during continuous monitoring of the patient's level of consciousness and physiological / cardiorespiratory status by the radiology RN, with a total moderate sedation time of 20 minutes. A 17 gauge trocar needle was advanced under ultrasound guidance into the liver to the margin of the lesion. 3 coaxial 18gauge core samples were then obtained through the guide needle. The guide needle was removed. Post procedure scans demonstrate no apparent complication. COMPLICATIONS: COMPLICATIONS None immediate FINDINGS: Ill-defined hepatic lesions were localized corresponding to CT findings. Representative core biopsy samples obtained as above. IMPRESSION: 1. Technically successful ultrasound guided core liver lesion biopsy. Electronically Signed   By: D Lucrezia Europe.D.   On: 11/10/2019 15:17   IR IMAGING GUIDED PORT INSERTION  Result Date: 11/17/2019 INDICATION:  [age]ear old male with colonic adenocarcinoma metastatic to the liver. He presents for port catheter placement for durable venous access. EXAM: IMPLANTED PORT A CATH PLACEMENT WITH ULTRASOUND AND FLUOROSCOPIC GUIDANCE MEDICATIONS: 2 g Ancef; The antibiotic was administered within an appropriate time interval prior to skin puncture. ANESTHESIA/SEDATION: Versed 4 mg IV; Fentanyl 100 mcg IV; Moderate Sedation Time:  23 minutes The patient was continuously monitored during the procedure by the interventional radiology nurse under my direct supervision. FLUOROSCOPY TIME:  0 minutes, 12 seconds (2 mGy) COMPLICATIONS: None immediate. PROCEDURE: The left neck and chest was prepped with chlorhexidine, and draped in the usual sterile fashion using maximum barrier technique (cap and mask, sterile gown, sterile gloves, large sterile sheet, hand hygiene and cutaneous antiseptic). Local anesthesia was attained by infiltration with 1% lidocaine with epinephrine. Ultrasound demonstrated patency of the left internal jugular vein, and this was documented with an image. Under real-time ultrasound guidance, this vein was accessed with a 21 gauge micropuncture needle and image documentation was performed. A small dermatotomy was made at the access site with an 11 scalpel. A 0.018" wire was advanced into the SVC and the access needle exchanged for a 53F micropuncture vascular sheath. The 0.018" wire was then removed and a 0.035" wire advanced into the IVC. An appropriate location for the subcutaneous reservoir was selected below the clavicle and an incision was made through the skin and underlying soft tissues. The subcutaneous tissues were then dissected using a combination of blunt and sharp surgical technique and a pocket was formed. A single lumen power injectable portacatheter was then tunneled through the subcutaneous tissues from the pocket to the dermatotomy and the port reservoir placed within the subcutaneous pocket. The venous  access site was then serially dilated and a peel  away vascular sheath placed over the wire. The wire was removed and the port catheter advanced into position under fluoroscopic guidance. The catheter tip is positioned in the upper right atrium. This was documented with a spot image. The portacatheter was then tested and found to flush and aspirate well. The port was flushed with saline followed by 100 units/mL heparinized saline. The pocket was then closed in two layers using first subdermal inverted interrupted absorbable sutures followed by a running subcuticular suture. The epidermis was then sealed with Dermabond. The dermatotomy at the venous access site was also closed with Dermabond. IMPRESSION: Successful placement of a left IJ approach Power Port with ultrasound and fluoroscopic guidance. The catheter is ready for use. Electronically Signed   By: Jacqulynn Cadet M.D.   On: 11/17/2019 15:10    ASSESSMENT AND PLAN: 1.  Stage IV colon cancer 2.  AKI, worsening  3.  Transaminitis/hyperbilirubinemia 4.  Nausea and vomiting 5.  Hypotension, improved 6.  Hyponatremia 7.  Anemia 8.  Hyperkalemia  -Recommend nephrology consult to determine if there are any reversible causes for his AKI. -LFTs stable  -Due to overall poor performance status, multiorgan failure, I do not think it is a candidate for more chemotherapy, unless there are reversible causes of condition improves.  -Patient's family would like to bring patient home, his sister is open to hospice. -No family at the bedside this morning, but hospice will be addressed again with family when MD rounds later today.   LOS: 3 days    Mikey Bussing  12/09/2019   Addendum  I have seen the patient, examined him. I agree with the assessment and and plan and have edited the notes.   Pt appears to be more confused when I saw him in later afternoon, family not at bed side. Chart reviewed, appreciate nephology and palliative care team's  assistance and input, I completely agree with hospice. Pt's sister plan to bring him home, hospice for coordinator Venia Carbon has contacted patient sister to set up his home hospice. 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 Will cancel his future appointments with [place].  Pt's abdomen seems to be distended today, he may benefit from a paracentesis before discharge for his comfort.  Truitt Merle  12/09/2019

## 2019-12-09 NOTE — TOC Transition Note (Signed)
Transition of Care [facility]) - CM/SW Discharge Note   Patient Details  Name: Zadin Lange MRN: [id number] Date of Birth: April 30, 1966  [facility]) CM/SW Contact:  Lynnell Catalan, RN Phone Number: 12/09/2019, 2:26 PM   Clinical Narrative:     This CM was contacted by PMT MD to inform of family choice for pt to go home with hospice. Choice was offered to sister Coralyn Mark and Lonia Chimera was chosen. [facility] called to give referral. DME to be delivered to the home and then Coralyn Mark will come pick pt up. Yellow DNR on chart to be given to family on dc.   Final next level of care: Home w Hospice Care Barriers to Discharge: Continued Medical Work up  Discharge Plan and Services   Discharge Planning Services: CM Consult Post Acute Care Choice: Hospice                    HH Arranged: Disease Management [place] Agency: Hospice and [place] Date [place]: 12/09/19 Time [place]: 83 Representative spoke with at [place]: [place] ([place]) Interventions     Readmission Risk Interventions Readmission Risk Prevention Plan 12/08/2019  Transportation Screening Complete  PCP or Specialist Appt within 3-5 Days Complete  HRI or [place] Complete  Social Work Consult for [place] Planning/Counseling Complete  Palliative Care Screening Complete  Medication Review [employer]) Complete

## 2019-12-09 NOTE — Plan of Care (Signed)
Pt remains hypotensive but MEWS improved to green with decrease in pulse.  Pain rated 8/10 this morning, PRN med administered.  No complaints at this time.   Problem: Clinical Measurements: Goal: Respiratory complications will improve Outcome: Progressing Goal: Cardiovascular complication will be avoided Outcome: Progressing   Problem: Safety: Goal: Ability to remain free from injury will improve Outcome: Progressing   Problem: Skin Integrity: Goal: Risk for impaired skin integrity will decrease Outcome: Progressing   Problem: Education: Goal: Knowledge of General Education information will improve Description: Including pain rating scale, medication(s)/side effects and non-pharmacologic comfort measures Outcome: Not Progressing   Problem: Health Behavior/Discharge Planning: Goal: Ability to manage health-related needs will improve Outcome: Not Progressing   Problem: Clinical Measurements: Goal: Ability to maintain clinical measurements within normal limits will improve Outcome: Not Progressing Goal: Will remain free from infection Outcome: Not Progressing Goal: Diagnostic test results will improve Outcome: Not Progressing   Problem: Activity: Goal: Risk for activity intolerance will decrease Outcome: Not Progressing   Problem: Nutrition: Goal: Adequate nutrition will be maintained Outcome: Not Progressing   Problem: Coping: Goal: Level of anxiety will decrease Outcome: Not Progressing   Problem: Elimination: Goal: Will not experience complications related to bowel motility Outcome: Not Progressing Goal: Will not experience complications related to urinary retention Outcome: Not Progressing   Problem: Pain Managment: Goal: General experience of comfort will improve Outcome: Not Progressing

## 2019-12-09 NOTE — Progress Notes (Signed)
[facility])   Referral received for hospice services at home.    Contacted sister Coralyn Mark, explained services and provided support.  Pt needs:  Hospital bed, wheelchair and 3n1.  Family does not necessarily need prior to him leaving, but would prefer it arrives first.  Pt has an adequate supply of MS contin at home.  May need something for nausea, as his sister was not sure he had anything.  May need ambulance transport home.  Coralyn Mark was going to discuss with their father and will update [facility].  Venia Carbon RN, BSN, [facility] Liaison

## 2019-12-09 NOTE — Progress Notes (Signed)
Daily Progress Note   Patient Name: Craig Flowers       Date: 12/09/2019 DOB: [birth year]  Age: 54 y.o. MRN#: [id number] Attending Physician: Kerney Elbe, DO Primary Care Physician: Deland Pretty, MD Admit Date: 12/06/2019  Reason for Consultation/Follow-up: Establishing goals of care, Non pain symptom management and Pain control  Subjective: I saw and examined Craig Flowers today.  He was sitting in the bedside chair on my arrival.  [name] drowsy but awakens easily.  He does seem more confused than I met with him and his family 2 days ago.  I discussed with him regarding renal failure and that this is not likely reversible.  I am not sure how much he understood of conversation, but he did state that he wants to be at home if he is not going to get better.  I called and spoke with his sister, Coralyn Mark, and his father, Sonia Side via phone.  Reviewed his clinical course since I last met with him 2 days ago with a concern that he has progressive renal failure that is not likely to improve.  I had spoken with Dr. Alfredia Ferguson this morning and reviewed my conversation with him and input from nephrology that this is not likely to improve and that Craig Flowers is not a good candidate for dialysis.    Patient sister and father expressed understanding and state that if time is short, Musa would want to be home.  We discussed options for care moving forward including recommendation for consideration of home with hospice versus residential hospice.  Family is clear that Lavaughn has stated he would want to be at home and they want to work to honor this.  We discussed regarding home hospice benefits and assistance that can be provided.  His sister reports that they would likely want to hire additional caregivers and requested  information from the hospital or hospice if they have any recommendations on how to go about finding assistance.  We also discussed plan to work to transition home with hospice support and concerns about logistics of transportation home.  I called and discussed the above with transition of care team.  Greatly appreciate assistance in working to try to get Mr. [place] home with the support of hospice.  Length of Stay: 3  Current Medications: Scheduled Meds:  . Chlorhexidine  Gluconate Cloth  6 each Topical Daily  . DULoxetine  20 mg Oral Daily  . feeding supplement (ENSURE ENLIVE)  237 mL Oral TID BM  . heparin  5,000 Units Subcutaneous Q8H  . mouth rinse  15 mL Mouth Rinse BID  . morphine  15 mg Oral Q12H  . multivitamin with minerals  1 tablet Oral Daily  . sodium chloride flush  10-40 mL Intracatheter Q12H    Continuous Infusions: . sodium chloride 100 mL/hr at 12/09/19 1024  . methocarbamol (ROBAXIN) IV Stopped (12/07/19 1843)  . sodium chloride      PRN Meds: magic mouthwash w/lidocaine, methocarbamol (ROBAXIN) IV, morphine injection, Muscle Rub, ondansetron (ZOFRAN) IV, oxyCODONE-acetaminophen, senna-docusate, sodium chloride flush  Physical Exam      General: Alert, awake, in no acute distress, but he appears more confused than previous encounter.  [name] able to follow some conversation, but then talks about "the people [place].".  HEENT: No bruits, no goiter, no JVD Heart: Regular rate and rhythm. No murmur appreciated. Lungs: Good air movement, clear Abdomen: mild globally tender, distended, positive bowel sounds.  Ext: Some edema Skin: Warm and dry Neuro: Grossly intact, nonfocal.      Vital Signs: BP (!) 96/57 (BP Location: Left Arm)   Pulse (!) 112   Temp 98.6 F (37 C) (Oral)   Resp 17   Ht _0  (1.854 m)   Wt 95.1 kg   SpO2 90%   BMI 27.66 kg/m  SpO2: SpO2: 90 % O2 Device: O2 Device: Room Air O2 Flow Rate:    Intake/output summary:    Intake/Output Summary (Last 24 hours) at 12/09/2019 1110 Last data filed at 12/09/2019 0400 Gross per 24 hour  Intake 1576.15 ml  Output 200 ml  Net 1376.15 ml   LBM: Last BM Date: 12/06/19 Baseline Weight: Weight: 91.1 kg Most recent weight: Weight: 95.1 kg       Palliative Assessment/Data:      Patient Active Problem List   Diagnosis Date Noted  . Hypotension 12/06/2019  . Hyponatremia 12/06/2019  . Ascites 12/06/2019  . Peritoneal carcinomatosis ([place]) 12/06/2019  . Elevated bilirubin 12/06/2019  . Normocytic anemia 12/06/2019  . Mucositis 11/26/2019  . Cancer related pain 11/26/2019  . Nausea and vomiting 11/26/2019  . AKI (acute kidney injury) ([place]) 11/18/2019  . Metastatic colon cancer to liver ([place]) 11/12/2019  . Goals of care, counseling/discussion 10/29/2019    Palliative Care Assessment & Plan   Assessment: 54 year old male with metastatic colon cancer with liver and peritoneal metastasis and further complication of progressive renal failure  Recommendations/Plan:  Pain: Currently well controlled per patient report.  Due to his decreased renal function, will transition off of MS Contin to OxyContin as this would be preferable with his poor kidney function.  I also discontinued IV morphine and replaced it with IV Dilaudid for the same reason.  Goals of care: Family understands that his prognosis is very limited with progressive renal failure in light of his metastatic disease.  His sister and father agree that his goal would be to be at home as he is approaching end-of-life.  Discussed options for hospice including home with hospice versus residential hospice facility.  Family is clear that he would want to be home and went to work to get him home with hospice support.  He does not have insurance and will request case management to help facilitate home hospice services with hope that charity care could be provided  for him.  His sister also would like information on  private duty caregivers to hire to help with his care at home.  Finally, will need to determine best way to get him to his home.  It appears he is able to pivot, and he may be able to go home by private vehicle with plan to get him into the house with wheelchair once he arrives.  Appreciate transition of care team assistance greatly.  Goals of Care and Additional Recommendations:  Limitations on Scope of Treatment: Avoid Hospitalization  Code Status:    Code Status Orders  (From admission, onward)         Start     Ordered   12/06/19 1449  Do not attempt resuscitation (DNR)  Continuous       Question Answer Comment  In the event of cardiac or respiratory ARREST Do not call a "code blue"   In the event of cardiac or respiratory ARREST Do not perform Intubation, CPR, defibrillation or ACLS   In the event of cardiac or respiratory ARREST Use medication by any route, position, wound care, and other measures to relive pain and suffering. May use oxygen, suction and manual treatment of airway obstruction as needed for comfort.      12/06/19 1450        Code Status History    Date Active Date Inactive Code Status Order ID Comments User Context   12/06/2019 1447 12/06/2019 1450 DNR [id number]  Desiree Hane, MD ED   Advance Care Planning Activity       Prognosis:   < 2 weeks most likely.  He now has progressive renal failure with climbing creatinine and decreased urine output.  Family goal is to transition him home with hospice services.  I do think that he would also be appropriate for residential hospice if desired, but family is clear that he would want to be at home and they want to work to get him there.  Appreciate case management greatly.  Discharge Planning:  Home with Hospice  Care plan was discussed with patient (although limited understanding), his sister, bedside RN, Dr. Alfredia Ferguson, and care management  Thank you for allowing the Palliative Medicine Team to assist in the  care of this patient.   Time In: 1020 Time Out: 1100 Total Time 40 Prolonged Time Billed No      Greater than 50%  of this time was spent counseling and coordinating care related to the above assessment and plan.  Micheline Rough, MD  Please contact Palliative Medicine Team phone at [phone] for questions and concerns.

## 2019-12-09 NOTE — Progress Notes (Signed)
[facility])  Family is not willing to take Craig Flowers home tonight.  They wanted to take him POV, due to his lack of health insurance and cost constraints, arrived to pick him up POV and are overwhelmed at his current condition and inability to participate in transferring.    They state they cannot care for him like this tonight.  He will need [facility] to take him home in the morning.  Please call his sister in the AM once transport has been called so she can plan for his arrival.  [facility] liaison will f/u in the morning with [facility] manager.  Venia Carbon RN, BSN, [facility] Liaison

## 2019-12-09 NOTE — Progress Notes (Addendum)
Pt discharged today with plans for family to take him home with hospice care.  At the time of discharge, family stated that they needed to discuss the situation as they realized the difficulty in transporting the patient to their residence.  Family member stated that they would return to the unit but did not return and instead contacted home hospice provider and stated that they could not take patient home this evening and would need for him to stay overnight.  TOC contacted and informed of situation.  Stated that they would contact family and let them know that [facility] would be transporting patient to their residence this evening.  Unit awaiting confirmation from [facility] team that [facility] can be contacted for patient pickup.  Pt family were given both discharge instructions and [place]/DNR document which they took with them.  Discharge instructions were reviewed with family.

## 2019-12-09 NOTE — Discharge Summary (Addendum)
Physician Discharge Summary  Craig Flowers DGL:[id number] DOB: 10-19-1965 DOA: 12/06/2019  PCP: Craig Pretty, MD  Admit date: 12/06/2019 Discharge date: 12/09/2019  Admitted From: Home Disposition: Home with Hospice   Recommendations for Outpatient Follow-up:  Follow up care per Hospice Protocol  Home Health: No Equipment/Devices: Hospital Bed, WheelChair, 3in1   Discharge Condition: Guarded, Poor CODE STATUS: DO NOT RESUSCITATE Diet recommendation: Heart Healthy Diet  Brief/Interim Summary: Brief Narrative: Patient is a 54 year old Caucasian male with a past medical history significant for but not limited to metastatic colon cancer with metastasis to the liver as well as peritoneal metastasis currently undergoing chemotherapy under the direction of Dr. Burr Flowers, chronic cancer-related pain, chronic nausea and vomiting with decreased appetite related malignancy, hypertension as well as other comorbidities who presented on 11/28/2018 with worsening abdominal pain, increased nausea increasing vomiting and was found to have hypotension.  During his office visit to oncology he also was noted to have an AKI.  He was sent over to the hospital for further management.  CT of the abdomen pelvis showed progressive peritoneal carcinomatosis with increased omental caking.  Moderate ascites was also noted.  Medical Oncology, Palliative Care have been consulted and will run the case by Nephrology. He had nausea yesterday AM but Vomiting has improved.  Nephrology formally evaluated and feel that his acute renal failure is in the setting of hypotension and severe ascites suggesting hepatorenal physiology similar to seen in advanced cirrhotics.  He also feels that could be possibly due to ATN to TLS.  Dr. Burnett Flowers does not feel that his renal function will improve and currently he is not a candidate for hemodialysis given his comorbidities and he recommends transitioning to hospice at the best option.  Palliative care  met with the patient's sister and after lengthy goals of care discussion they have elected to take the patient home with hospice and hospice has been consulted.  Patient is to be discharged home with hospice today with further care per hospice protocol.  ADDENDUM 12/10/19: Patient unfortunately did not leave yesterday as family was unable to transport him home at that time. He remains stable to be D/C'd home with hospice and has been transported back home. Further care per Hospice Protocol to be initiated. Prognosis remains extremely poor.   Discharge Diagnoses:  Active Problems:   Metastatic colon cancer to liver (HCC)   AKI (acute kidney injury) ([facility])   Cancer related pain   Nausea and vomiting   Hypotension   Hyponatremia   Ascites   Peritoneal carcinomatosis (HCC)   Elevated bilirubin   Normocytic anemia  Nausea/Vomiting/Abdominal Pain likely due to progression of peritoneal carcinomatosis -CT scan shows progressive disease.  -He was found to have elevated bilirubin and now T Bili is 3.5.  -ltrasound of the abdomen does not show any biliary ductal dilatation. His AST is noted to be elevated. -Alkaline phosphatase was 359 and today is 328.  -His nausea and vomiting appears to have subsided but he did have some this AM.  -There was no evidence of obstruction on the CT scan.  -He tolerated his supper last night.  -His creatinine continues to worsen and after lengthy discussion with palliative and patient's family patient will be going home with hospice  Encephalopathy -In the setting of his uremia from below -We will be going home with hospice  Acute Kidney Injury, worsening Metabolic Acidosis Likely due to nausea vomiting and decreased oral intake and ? Hypotension -Creatinine was 2.51 at admission and has significantly worsened  and now has a BUN/creatinine of 57/5.46 -Continue IV Hydration with NS at 100 mL/hr.  -Patient had an acidosis today with a CO2 of 17, anion gap  of 15, chloride level of 94 -Monitor urine output and Strict I's and O's. Baseline creatinine is normal.  -No hydronephrosis noted on ultrasound. -U/A was unremarkable  -[name] very little urine output -Avoid nephrotoxic medications, contrast dyes, hypotension and renally dose medications -Continue to monitor and trend renal function carefully and repeat CMP in a.m. -We discuss with Nephrology for further evaluation and assistance; nephrology formally evaluated and they feel his renal function will not recover and that he is not a candidate for dialysis so they are recommending transition to hospice  Hypotension -Likely due to hypovolemia.  -Improved with IV hydration and IVF hydration was stopped for some reason but I resumed it at 100 mL/hr -Continue to Hold his Antihypertensives. -We will discontinue them at discharge  Hypovolemic Hyponatremia -Sodium is 126 this morning.  -Continue normal saline for now as in the AM.  -Recheck labs tomorrow.  -We will also check urine osmolality,TSH, cortisol levels. Urine sodium and Urine Cr -TSH was 7.719, Cortisol level was 28.9 -Continue to Monitor and Trend -Will not repeat CMP as he is going to go home with hospice  Cancer related pain, acute on chronic -Continue with his MS Contin 15 mg po q12h however this was changed to IV Dilaudid by palliative.  Given his worsening renal function we will continue with oxycodone as needed at discharge -[name] also on IV morphine  2 mg q3hprn Severe Pain and oral Percocet 1-2 Tab po q4hprn Severe Pain -C/w Methocarbamol 500 mg IV q8hprn Muscle Spasms while hospitalized -C/w Bowel Regimen   Hyperkalemia -In the setting of his renal failure -Patient with potassium was 5.9 -Given a dose of Lokelma prior to discharge but will not check his labs given that he is going go home with hospice.  Elevated AST  Hyperbilirubinemia  -Question reactive in setting of his peritoneal carcinomatosis and Liver  Mets -AST went from 106 down to 96 and today it is 130; T Bili is now 3.1 -Continue monitor and trend and if necessary will check an acute hepatitis panel -CT Abd/Pelvis showed "Numerous hepatic mass lesions consistent with widespread hepatic metastatic disease. LEFT lobe lesion 3.9 x 3.0 cm image 16 previously 3.7 x 2.9 cm. Additional lesions appear to have slightly increased in size as well. Gallbladder contracted." -Continue to monitor and trend given that he is going to go home with hospice  Normocytic Anemia -Likely due to chronic disease.  -No evidence of overt bleeding.  -Mild drop in hemoglobin is likely dilutional. -Patient's Hgb/Hct stable at 9.0/20.9 -Anemia panel done and showed an iron level of 19, U IBC 159, TIBC 178, saturation ratios of 11%, vitamin B12 level of 777 -Continue to Monitor for S/Sx of Bleeding; Currently no overt bleeding noted -We will not recheck given that his prognosis is poor and that he will be going home with hospice  Mood disorder, stable -Continue Duloxetine 20 mg po Daily  and further care per hospice protocol  Muscle Pain -Patient complains of muscle pain diffusely.  -He specifically mentioned pain in both his shoulders.  -Decreased range of motion is noted.  -No abnormal swelling noted in the shoulder or over the humerus.  -He denies any falls.  -He also has discomfort in his thighs.  -We will try muscle relaxant as above.  -Hold off on imaging studies for  now. Patient agreeable to this. -Check ESR and CRP -Because Proximal Muscles are involved ? PMR  -Continue to Monitor Carefully -We will go home with hospice today  Leukocytosis -Worsening as WBC went from 11.0 -> 10.6 -> 14.5 and 2219.6 -Continue to Monitor and Trend Carefully -Currently Afebrile -Oncology Following and his prognosis is extremely poor so he will not recheck and he will be going home with hospice today.  Thrombocytosis -Patient's Platelet Count went  from 460 -> 403 -> 442 -> 409 -We will not continue to monitor and trend as patient's going home with hospice today  GOC: DNR, poA -Family has elected to take the patient home with hospice given that his prognosis is poor and that his renal function will likely not recover per nephrology recommendations  Multiorgan failure -Patient is actively dying he has a very poor prognosis.  Will be transitioning to hospice at home  Discharge Instructions  Discharge Instructions    Call MD for:  difficulty breathing, headache or visual disturbances   Complete by: As directed    Call MD for:  extreme fatigue   Complete by: As directed    Call MD for:  hives   Complete by: As directed    Call MD for:  persistant dizziness or light-headedness   Complete by: As directed    Call MD for:  persistant nausea and vomiting   Complete by: As directed    Call MD for:  redness, tenderness, or signs of infection (pain, swelling, redness, odor or green/yellow discharge around incision site)   Complete by: As directed    Call MD for:  severe uncontrolled pain   Complete by: As directed    Call MD for:  temperature >100.4   Complete by: As directed    Diet - low sodium heart healthy   Complete by: As directed    Discharge instructions   Complete by: As directed    You were cared for by a hospitalist during your hospital stay. If you have any questions about your discharge medications or the care you received while you were in the hospital after you are discharged, you can call the unit and ask to speak with the hospitalist on call if the hospitalist that took care of you is not available. Once you are discharged, your primary care physician will handle any further medical issues. Please note that NO REFILLS for any discharge medications will be authorized once you are discharged, as it is imperative that you return to your primary care physician (or establish a relationship with a primary care physician if you do  not have one) for your aftercare needs so that they can reassess your need for medications and monitor your lab values if necessary.  Further care per Hospice Protocol. Take all medications as prescribed. If symptoms change or worsen please return to the ED for evaluation   Increase activity slowly   Complete by: As directed      Allergies as of 12/09/2019   No Known Allergies     Medication List    STOP taking these medications   lidocaine-prilocaine cream Commonly known as: EMLA   losartan 100 MG tablet Commonly known as: COZAAR   morphine 15 MG 12 hr tablet Commonly known as: MS CONTIN   traZODone 50 MG tablet Commonly known as: DESYREL     TAKE these medications   acetaminophen 325 MG tablet Commonly known as: TYLENOL Take 650 mg by mouth every 6 (six) hours as needed (  Take with tramadol.).   dexamethasone 4 MG tablet Commonly known as: DECADRON Take 1 tablet twice a day for 3-4 days following chemotherapy for nausea   DULoxetine 20 MG capsule Commonly known as: CYMBALTA Take 1 capsule (20 mg total) by mouth daily.   feeding supplement (ENSURE ENLIVE) Liqd Take 237 mLs by mouth 3 (three) times daily between meals.   magic mouthwash w/lidocaine Soln Take 5 mLs by mouth 4 (four) times daily as needed for mouth pain.   multivitamin with minerals Tabs tablet Take 1 tablet by mouth daily.   Muscle Rub 10-15 % Crea Apply 1 application topically as needed for muscle pain.   ondansetron 8 MG disintegrating tablet Commonly known as: Zofran ODT Take 1 tablet (8 mg total) by mouth every 8 (eight) hours as needed for nausea or vomiting.   oxyCODONE 5 MG immediate release tablet Commonly known as: Oxy IR/ROXICODONE Take 1-2 tablets (5-10 mg total) by mouth every 6 (six) hours as needed for breakthrough pain. What changed: reasons to take this   prochlorperazine 10 MG tablet Commonly known as: COMPAZINE Take 1 tablet (10 mg total) by mouth every 6 (six) hours as  needed for nausea or vomiting.   senna-docusate 8.6-50 MG tablet Commonly known as: Senokot-S Take 1 tablet by mouth at bedtime as needed for mild constipation.   Vitamin C 500 MG Chew Chew 1 tablet by mouth daily.       No Known Allergies  Consultations:  Medical Oncology  Palliative Care Medicine  Nephrology  Procedures/Studies: CT Abdomen Pelvis Wo Contrast  Result Date: 12/06/2019 CLINICAL DATA:  Abdominal distension, abdominal pain greater in lower abdomen, history colon cancer, hypertension EXAM: CT ABDOMEN AND PELVIS WITHOUT CONTRAST TECHNIQUE: Multidetector CT imaging of the abdomen and pelvis was performed following the standard protocol without IV contrast. Sagittal and coronal MPR images reconstructed from axial data set. COMPARISON:  CT abdomen 10/27/2019 FINDINGS: Lower chest: Small RIGHT and minimal LEFT pleural effusions with basilar atelectasis Hepatobiliary: Numerous hepatic mass lesions consistent with widespread hepatic metastatic disease. LEFT lobe lesion 3.9 x 3.0 cm image 16 previously 3.7 x 2.9 cm. Additional lesions appear to have slightly increased in size as well. Gallbladder contracted. Pancreas: Unremarkable Spleen: Normal appearance Adrenals/Urinary Tract: 1.8 x 2.0 cm. Nonobstructing RIGHT renal calculus. No renal mass or hydronephrosis. Ureters and bladder unremarkable. Normal appearance. New LEFT adrenal metastasis Stomach/Bowel: Stomach decompressed. No bowel dilatation or obstruction. Minimal distal colonic diverticulosis. Vascular/Lymphatic: Aorta normal caliber. Periportal adenopathy, largest node 2.2 cm short axis image 34. Reproductive: Prostate gland and seminal vesicles unremarkable Other: Moderate ascites. Progressive peritoneal carcinomatosis and slightly increased omental caking. No free air. No hernia. Musculoskeletal: Unremarkable IMPRESSION: Progressive peritoneal carcinomatosis with increased omental caking and new moderate ascites. Slight  increases in sizes of hepatic metastases. New LEFT adrenal metastasis. Small RIGHT and minimal LEFT pleural effusions with basilar atelectasis. Nonobstructing RIGHT renal calculus. Electronically Signed   By: Lavonia Dana M.D.   On: 12/06/2019 13:15   US Abdomen Complete  Result Date: 12/07/2019 CLINICAL DATA:  Elevated bilirubin EXAM: ABDOMEN ULTRASOUND COMPLETE COMPARISON:  Abdominal CT from yesterday FINDINGS: Gallbladder: No gallstones or wall thickening visualized. No sonographic Murphy sign noted by sonographer. Common bile duct: Diameter: 4 mm Liver: Heterogeneous liver from known metastatic disease by CT. Portal vein is patent on color Doppler imaging with normal direction of blood flow towards the liver. IVC: No abnormality visualized. Pancreas: Limited assessment Spleen: Size and appearance within normal limits. Right Kidney: Length:  12.3 cm. Echogenicity within normal limits. No mass or hydronephrosis visualized. Left Kidney: Length: 14 cm. Echogenicity within normal limits. No mass or hydronephrosis visualized. Abdominal aorta: No aneurysm visualized. Other findings: Trace ascites. Right pleural effusion. These findings are known from preceding abdominal CT IMPRESSION: No additional finding when compared with CT from yesterday. Diffuse hepatic metastatic disease; no bile duct dilatation. Electronically Signed   By: Monte Fantasia M.D.   On: 12/07/2019 06:52   DG Chest Port 1 View  Result Date: 12/06/2019 CLINICAL DATA:  Hypotension and weakness. History of metastatic colon cancer. EXAM: PORTABLE CHEST 1 VIEW COMPARISON:  10/22/2019 FINDINGS: The left IJ power port is in good position. The cardiac silhouette, mediastinal and hilar contours are within normal limits and stable. Low lung volumes with vascular crowding and bibasilar atelectasis. There is a small right pleural effusion noted. The bony thorax is intact. IMPRESSION: 1. Low lung volumes with vascular crowding and bibasilar atelectasis. 2.  Small right pleural effusion. Electronically Signed   By: Marijo Sanes M.D.   On: 12/06/2019 12:23   [place] CORE BIOPSY (LIVER)  Result Date: 11/10/2019 CLINICAL DATA:  Colon mass.  Multiple liver lesions. EXAM: ULTRASOUND-GUIDED CORE LIVER BIOPSY TECHNIQUE: An ultrasound guided liver biopsy was thoroughly discussed with the patient and questions were answered. The benefits, risks, alternatives, and complications were also discussed. The patient understands and wishes to proceed with the procedure. A verbal as well as written consent was obtained. Survey ultrasound of the liver was performed, representative lesion localized, and an appropriate skin entry site was determined. Skin site was marked, prepped with chlorhexidine, and draped in usual sterile fashion, and infiltrated locally with 1% lidocaine. Intravenous Fentanyl 95mg and Versed 146mwere administered as conscious sedation during continuous monitoring of the patient's level of consciousness and physiological / cardiorespiratory status by the radiology RN, with a total moderate sedation time of 20 minutes. A 17 gauge trocar needle was advanced under ultrasound guidance into the liver to the margin of the lesion. 3 coaxial 18gauge core samples were then obtained through the guide needle. The guide needle was removed. Post procedure scans demonstrate no apparent complication. COMPLICATIONS: COMPLICATIONS None immediate FINDINGS: Ill-defined hepatic lesions were localized corresponding to CT findings. Representative core biopsy samples obtained as above. IMPRESSION: 1. Technically successful ultrasound guided core liver lesion biopsy. Electronically Signed   By: D Lucrezia Europe.D.   On: 11/10/2019 15:17   IR IMAGING GUIDED PORT INSERTION  Result Date: 11/17/2019 INDICATION: [age]ear old male with colonic adenocarcinoma metastatic to the liver. He presents for port catheter placement for durable venous access. EXAM: IMPLANTED PORT A CATH PLACEMENT WITH  ULTRASOUND AND FLUOROSCOPIC GUIDANCE MEDICATIONS: 2 g Ancef; The antibiotic was administered within an appropriate time interval prior to skin puncture. ANESTHESIA/SEDATION: Versed 4 mg IV; Fentanyl 100 mcg IV; Moderate Sedation Time:  23 minutes The patient was continuously monitored during the procedure by the interventional radiology nurse under my direct supervision. FLUOROSCOPY TIME:  0 minutes, 12 seconds (2 mGy) COMPLICATIONS: None immediate. PROCEDURE: The left neck and chest was prepped with chlorhexidine, and draped in the usual sterile fashion using maximum barrier technique (cap and mask, sterile gown, sterile gloves, large sterile sheet, hand hygiene and cutaneous antiseptic). Local anesthesia was attained by infiltration with 1% lidocaine with epinephrine. Ultrasound demonstrated patency of the left internal jugular vein, and this was documented with an image. Under real-time ultrasound guidance, this vein was accessed with a 21 gauge micropuncture needle and image documentation was performed.  A small dermatotomy was made at the access site with an 11 scalpel. A 0.018" wire was advanced into the SVC and the access needle exchanged for a 57F micropuncture vascular sheath. The 0.018" wire was then removed and a 0.035" wire advanced into the IVC. An appropriate location for the subcutaneous reservoir was selected below the clavicle and an incision was made through the skin and underlying soft tissues. The subcutaneous tissues were then dissected using a combination of blunt and sharp surgical technique and a pocket was formed. A single lumen power injectable portacatheter was then tunneled through the subcutaneous tissues from the pocket to the dermatotomy and the port reservoir placed within the subcutaneous pocket. The venous access site was then serially dilated and a peel away vascular sheath placed over the wire. The wire was removed and the port catheter advanced into position under fluoroscopic  guidance. The catheter tip is positioned in the upper right atrium. This was documented with a spot image. The portacatheter was then tested and found to flush and aspirate well. The port was flushed with saline followed by 100 units/mL heparinized saline. The pocket was then closed in two layers using first subdermal inverted interrupted absorbable sutures followed by a running subcuticular suture. The epidermis was then sealed with Dermabond. The dermatotomy at the venous access site was also closed with Dermabond. IMPRESSION: Successful placement of a left IJ approach Power Port with ultrasound and fluoroscopic guidance. The catheter is ready for use. Electronically Signed   By: Jacqulynn Cadet M.D.   On: 11/17/2019 15:10     Subjective: Seen and examined at bedside he was agitated and confused intermittently.  Is also complaining of not being comfortable cursing.  Nephrology evaluated and they do not feel his renal function will recover and they recommended hospice.  After lengthy discussion with the patient's sister and palliative care medicine he will be transition to hospice at home and [employer] is arrange equipment.  [name] stable to be discharged home with hospice today given that he has extremely poor prognosis.   Discharge Exam: Vitals:   12/08/19 2200 12/09/19 0546  BP: 109/63 (!) 96/57  Pulse: 97 (!) 112  Resp: 17 17  Temp: 97.9 F (36.6 C) 98.6 F (37 C)  SpO2: 91% 90%   Vitals:   12/08/19 0609 12/08/19 1423 12/08/19 2200 12/09/19 0546  BP:  93/60 109/63 (!) 96/57  Pulse:  91 97 (!) 112  Resp:  18 17 17   Temp:  (!) 97.5 F (36.4 C) 97.9 F (36.6 C) 98.6 F (37 C)  TempSrc:  Oral Oral Oral  SpO2:  94% 91% 90%  Weight: 93.2 kg   95.1 kg  Height:       General: Pt is awake but appears uncomfortable Cardiovascular: RRR, S1/S2 +, no rubs, no gallops Respiratory: Diminished bilaterally, no wheezing, no rhonchi Abdominal: Soft, NT, distended secondary habitus, bowel sounds  + Extremities: 1+ edema, no cyanosis  The results of significant diagnostics from this hospitalization (including imaging, microbiology, ancillary and laboratory) are listed below for reference.    Microbiology: Recent Results (from the past 240 hour(s))  Culture, blood (routine x 2)     Status: None (Preliminary result)   Collection Time: 12/06/19 11:42 AM   Specimen: BLOOD  Result Value Ref Range Status   Specimen Description   Final    BLOOD PORTA CATH Performed at [facility] [address], [place], [place] [address]    Special Requests  Final    BOTTLES DRAWN AEROBIC AND ANAEROBIC Blood Culture adequate volume Performed at [facility] [address] [place], [place] [address]    Culture   Final    NO GROWTH 3 DAYS Performed at [facility], [place] [address] [place], [place] [address]    Report Status PENDING  Incomplete  Culture, blood (routine x 2)     Status: None (Preliminary result)   Collection Time: 12/06/19 11:42 AM   Specimen: BLOOD  Result Value Ref Range Status   Specimen Description   Final    BLOOD LEFT ANTECUBITAL Performed at [name] [address] [place], [place] [address]    Special Requests   Final    BOTTLES DRAWN AEROBIC AND ANAEROBIC Blood Culture adequate volume Performed at [facility] [address], [place], [place] [address]    Culture   Final    NO GROWTH 3 DAYS Performed at [facility], [place] [address] [place], [place] [address]    Report Status PENDING  Incomplete  SARS Coronavirus 2 by RT PCR (hospital order, performed in [facility] hospital lab) Nasopharyngeal Nasopharyngeal Swab     Status: None   Collection Time: 12/06/19 11:42 AM   Specimen: Nasopharyngeal Swab  Result Value Ref Range Status   SARS Coronavirus 2 NEGATIVE NEGATIVE Final    Comment: (NOTE) SARS-CoV-2 target nucleic acids are NOT DETECTED.  The SARS-CoV-2 RNA is  generally detectable in upper and lower respiratory specimens during the acute phase of infection. The lowest concentration of SARS-CoV-2 viral copies this assay can detect is 250 copies / mL. A negative result does not preclude SARS-CoV-2 infection and should not be used as the sole basis for treatment or other patient management decisions.  A negative result may occur with improper specimen collection / handling, submission of specimen other than nasopharyngeal swab, presence of viral mutation(s) within the areas targeted by this assay, and inadequate number of viral copies (<250 copies / mL). A negative result must be combined with clinical observations, patient history, and epidemiological information.  Fact Sheet for Patients:   StrictlyIdeas.no  Fact Sheet for Healthcare Providers: BankingDealers.co.za  This test is not yet approved or  cleared by the [place] FDA and has been authorized for detection and/or diagnosis of SARS-CoV-2 by FDA under an Emergency Use Authorization (EUA).  This EUA will remain in effect (meaning this test can be used) for the duration of the COVID-19 declaration under Section 564(b)(1) of the Act, 21 U.S.C. section 360bbb-3(b)(1), unless the authorization is terminated or revoked sooner.  Performed at [facility], [place] [address], [place], [place] [address]   MRSA PCR Screening     Status: None   Collection Time: 12/06/19  3:35 PM   Specimen: Nasopharyngeal  Result Value Ref Range Status   MRSA by PCR NEGATIVE NEGATIVE Final    Comment:        The GeneXpert MRSA Assay (FDA approved for NASAL specimens only), is one component of a comprehensive MRSA colonization surveillance program. It is not intended to diagnose MRSA infection nor to guide or monitor treatment for MRSA infections. Performed at [facility], [place] [address] [place],  [address]    Urine culture     Status: None   Collection Time: 12/06/19  6:49 PM   Specimen: Urine, Random  Result Value Ref Range Status   Specimen Description   Final  URINE, RANDOM Performed at [facility], [place] [address], [place], [place] [address]    Special Requests   Final    NONE Performed at [facility], [place] [address], [place], [place] [address]    Culture   Final    NO GROWTH Performed at [facility], [place] [address], [place], [place] [address]    Report Status 12/08/2019 FINAL  Final    Labs: BNP (last 3 results) No results for input(s): BNP in the last 8760 hours. Basic Metabolic Panel: Recent Labs  Lab 12/06/19 0915 12/07/19 0345 12/08/19 0626 12/08/19 1831 12/09/19 0615  NA 127* 126* 127* 128* 126*  K 4.7 4.4 5.1 5.3* 5.9*  CL 91* 94* 93* 93* 94*  CO2 21* 25 19* 20* 17*  GLUCOSE 105* 109* 96 105* 80  BUN 29* 35* 49* 51* 57*  CREATININE 2.51* 3.16* 4.73* 5.04* 5.46*  CALCIUM 9.4 8.4* 8.8* 8.7* 8.7*  MG  --   --   --   --  2.7*  PHOS  --   --   --   --  7.7*   Liver Function Tests: Recent Labs  Lab 12/06/19 0915 12/07/19 0345 12/08/19 0626 12/08/19 1831 12/09/19 0615  AST 121* 106* 96* 103* 130*  ALT 43 34 34 34 36  ALKPHOS 435* 359* 328* 333* 306*  BILITOT 3.2* 3.2* 3.5* 3.4* 3.1*  PROT 7.0 6.2* 6.6 6.9 6.6  ALBUMIN 2.0* 2.0* 2.0* 2.1* 2.2*   No results for input(s): LIPASE, AMYLASE in the last 168 hours. No results for input(s): AMMONIA in the last 168 hours. CBC: Recent Labs  Lab 12/06/19 0915 12/07/19 0345 12/08/19 0500 12/09/19 0615  WBC 11.0* 10.6* 14.5* 19.6*  NEUTROABS 8.3*  --   --  17.1*  HGB 10.2* 9.1* 9.4* 9.0*  HCT 32.0* 28.8* 30.3* 28.9*  MCV 84.2 84.7 86.6 86.5  PLT 460* 403* 442* 409*   Cardiac Enzymes: No results for input(s): CKTOTAL, CKMB, CKMBINDEX, TROPONINI in the last 168 hours. BNP: Invalid input(s): POCBNP CBG: No results for input(s): GLUCAP in the last 168  hours. D-Dimer No results for input(s): DDIMER in the last 72 hours. Hgb A1c No results for input(s): HGBA1C in the last 72 hours. Lipid Profile No results for input(s): CHOL, HDL, LDLCALC, TRIG, CHOLHDL, LDLDIRECT in the last 72 hours. Thyroid function studies Recent Labs    12/08/19 0626  TSH 7.719*   Anemia work up Recent Labs    12/09/19 0615  RETICCTPCT 2.8   Urinalysis    Component Value Date/Time   COLORURINE AMBER (A) 12/06/2019 1849   APPEARANCEUR CLOUDY (A) 12/06/2019 1849   LABSPEC 1.014 12/06/2019 1849   PHURINE 5.0 12/06/2019 1849   GLUCOSEU NEGATIVE 12/06/2019 1849   HGBUR SMALL (A) 12/06/2019 1849   BILIRUBINUR NEGATIVE 12/06/2019 [place] 12/06/2019 1849   PROTEINUR 30 (A) 12/06/2019 1849   NITRITE NEGATIVE 12/06/2019 1849   LEUKOCYTESUR NEGATIVE 12/06/2019 1849   Sepsis Labs Invalid input(s): PROCALCITONIN,  WBC,  LACTICIDVEN Microbiology Recent Results (from the past 240 hour(s))  Culture, blood (routine x 2)     Status: None (Preliminary result)   Collection Time: 12/06/19 11:42 AM   Specimen: BLOOD  Result Value Ref Range Status   Specimen Description   Final    BLOOD PORTA CATH Performed at [facility], [place] [address], [place], [place] [address]    Special Requests   Final    BOTTLES  DRAWN AEROBIC AND ANAEROBIC Blood Culture adequate volume Performed at [place] [address] [place], [place] [address]    Culture   Final    NO GROWTH 3 DAYS Performed at [facility], [place] [address], [place], [place] [address]    Report Status PENDING  Incomplete  Culture, blood (routine x 2)     Status: None (Preliminary result)   Collection Time: 12/06/19 11:42 AM   Specimen: BLOOD  Result Value Ref Range Status   Specimen Description   Final    BLOOD LEFT ANTECUBITAL Performed at [place] [address], [place], [place] [address]    Special Requests    Final    BOTTLES DRAWN AEROBIC AND ANAEROBIC Blood Culture adequate volume Performed at [place] [address] [place], [place] [address]    Culture   Final    NO GROWTH 3 DAYS Performed at [facility], [place] [address], [place], [place] [address]    Report Status PENDING  Incomplete  SARS Coronavirus 2 by RT PCR (hospital order, performed in [facility] hospital lab) Nasopharyngeal Nasopharyngeal Swab     Status: None   Collection Time: 12/06/19 11:42 AM   Specimen: Nasopharyngeal Swab  Result Value Ref Range Status   SARS Coronavirus 2 NEGATIVE NEGATIVE Final    Comment: (NOTE) SARS-CoV-2 target nucleic acids are NOT DETECTED.  The SARS-CoV-2 RNA is generally detectable in upper and lower respiratory specimens during the acute phase of infection. The lowest concentration of SARS-CoV-2 viral copies this assay can detect is 250 copies / mL. A negative result does not preclude SARS-CoV-2 infection and should not be used as the sole basis for treatment or other patient management decisions.  A negative result may occur with improper specimen collection / handling, submission of specimen other than nasopharyngeal swab, presence of viral mutation(s) within the areas targeted by this assay, and inadequate number of viral copies (<250 copies / mL). A negative result must be combined with clinical observations, patient history, and epidemiological information.  Fact Sheet for Patients:   StrictlyIdeas.no  Fact Sheet for Healthcare Providers: BankingDealers.co.za  This test is not yet approved or  cleared by the [place] FDA and has been authorized for detection and/or diagnosis of SARS-CoV-2 by FDA under an Emergency Use Authorization (EUA).  This EUA will remain in effect (meaning this test can be used) for the duration of the COVID-19 declaration under Section 564(b)(1) of the Act, 21 U.S.C. section  360bbb-3(b)(1), unless the authorization is terminated or revoked sooner.  Performed at [facility], [place] [address] [place], [place] [address]   MRSA PCR Screening     Status: None   Collection Time: 12/06/19  3:35 PM   Specimen: Nasopharyngeal  Result Value Ref Range Status   MRSA by PCR NEGATIVE NEGATIVE Final    Comment:        The GeneXpert MRSA Assay (FDA approved for NASAL specimens only), is one component of a comprehensive MRSA colonization surveillance program. It is not intended to diagnose MRSA infection nor to guide or monitor treatment for MRSA infections. Performed at [facility], [place] [address] [place], [place] [address]   Urine culture     Status: None   Collection Time: 12/06/19  6:49 PM   Specimen: Urine, Random  Result Value Ref Range Status   Specimen Description   Final    URINE, RANDOM Performed at [facility]  [place] [address], [place], [place] [address]    Special Requests   Final    NONE Performed at [facility], [place] [address], [place], [place] [address]    Culture   Final    NO GROWTH Performed at [facility], [place] [address] [place],  [address]    Report Status 12/08/2019 FINAL  Final   Time coordinating discharge: 35 minutes  SIGNED:  Kerney Elbe, DO [facility] 12/09/2019, 11:46 AM Pager is on [place]  If 7PM-7AM, please contact night-coverage [email or web address]

## 2019-12-09 NOTE — Consult Note (Signed)
Renal Service Consult Note [facility]  Craig Flowers 12/09/2019 Craig Flowers Requesting Physician:  Dr Craig Flowers, Jenetta Downer.   Reason for Consult:  Renal failure HPI: The patient is a 54 y.o. year-old w/ hx of metastatic colon Ca (liver, peritoneal mets) on chemoRx, chronic pain, HTN admitted on 6/28 w/ worsneing abd pain, nausea / vomiting and hypotension. Labs showed ^'d creat, pt admitted for AKI.  Asked to see for renal failure.   CT abd 6/28 > CT abdomen showed progressive peritoneal carcinomatosis with increased omental caking and new moderate ascites with increase in size of hepatic metastasis and new left adrenal metastasis as well as small bilateral pleural effusions.  CT and US show normal appearing kidneys w/o hydro or ^'d echo.   UOP has been 0- 200 cc/ day.  Total I/O are 5 L in and 420 cc out Wt's are up 4 kg from admit.   Baseline creat from May- June  2021 is 0.95- 1.15 Creat here was 2.51 on 6/28 > 3.6 > 4.7 yest and 5.46 today despite IVF's BP' s here have been soft in the 90's and low 100's  Pt seen in room, he is a bit lethargic w/ some spont myoclonic jerking and slurred speech.  Able to answer most questions.    ROS  denies CP  no joint pain   no HA  no blurry vision  no rash  no diarrhea  no nausea/ vomiting  no dysuria  no difficulty voiding  no change in urine color    Past Medical History  Past Medical History:  Diagnosis Date  . Cancer ([place])   . Hypertension    Past Surgical History  Past Surgical History:  Procedure Laterality Date  . IR IMAGING GUIDED PORT INSERTION  11/17/2019   Family History History reviewed. No pertinent family history. Social History  reports that he has never smoked. His smokeless tobacco use includes snuff. He reports current alcohol use. He reports that he does not use drugs. Allergies No Known Allergies Home medications Prior to Admission medications   Medication Sig Start Date End Date Taking?  Authorizing Provider  Ascorbic Acid (VITAMIN C) 500 MG CHEW Chew 1 tablet by mouth daily.   Yes [provider]  DULoxetine (CYMBALTA) 20 MG capsule Take 1 capsule (20 mg total) by mouth daily. 11/26/19  Yes Heilingoetter, Cassandra L, PA-C  lidocaine-prilocaine (EMLA) cream Apply 1 application topically as needed. Patient taking differently: Apply 1 application topically daily as needed (access port).  11/18/19  Yes Truitt Merle, MD  losartan (COZAAR) 100 MG tablet Take 50 mg by mouth daily.    Yes [provider]  Multiple Vitamin (MULTIVITAMIN WITH MINERALS) TABS tablet Take 1 tablet by mouth daily.   Yes [provider]  ondansetron (ZOFRAN ODT) 8 MG disintegrating tablet Take 1 tablet (8 mg total) by mouth every 8 (eight) hours as needed for nausea or vomiting. 11/13/19  Yes Truitt Merle, MD  oxyCODONE (OXY IR/ROXICODONE) 5 MG immediate release tablet Take 1-2 tablets (5-10 mg total) by mouth every 6 (six) hours as needed for severe pain. 11/26/19  Yes Heilingoetter, Cassandra L, PA-C  prochlorperazine (COMPAZINE) 10 MG tablet Take 1 tablet (10 mg total) by mouth every 6 (six) hours as needed for nausea or vomiting. 11/13/19  Yes Truitt Merle, MD  acetaminophen (TYLENOL) 325 MG tablet Take 650 mg by mouth every 6 (six) hours as needed (Take with tramadol.).    [provider]  dexamethasone (DECADRON)  4 MG tablet Take 1 tablet twice a day for 3-4 days following chemotherapy for nausea 11/26/19   Heilingoetter, Cassandra L, PA-C  magic mouthwash w/lidocaine SOLN Take 5 mLs by mouth 4 (four) times daily as needed for mouth pain. 11/26/19   Heilingoetter, Cassandra L, PA-C  morphine (MS CONTIN) 15 MG 12 hr tablet Take 1 tablet (15 mg total) by mouth every 12 (twelve) hours. 12/06/19   Heilingoetter, Cassandra L, PA-C  traZODone (DESYREL) 50 MG tablet TAKE 1   1.5   2 TABLETS AT BEDTIME AS NEEDED Patient not taking: Reported on 12/06/2019 10/22/19   [provider]      Vitals:   12/08/19 0609 12/08/19 1423 12/08/19 2200 12/09/19 0546  BP:  93/60 109/63 (!) 96/57  Pulse:  91 97 (!) 112  Resp:  18 17 17   Temp:  (!) 97.5 F (36.4 C) 97.9 F (36.6 C) 98.6 F (37 C)  TempSrc:  Oral Oral Oral  SpO2:  94% 91% 90%  Weight: 93.2 kg   95.1 kg  Height:       Exam Gen lethargic, mild spont myoclonic jerking of extremities No rash, cyanosis or gangrene Sclera anicteric, throat clear and dry  +JVD Chest clear bilat to bases no rales, wheezing or bronchial BS RRR no MRG Abd distended, nontender, + sig ascites GU normal male  MS no joint effusions or deformity Ext 1+ bilat pretib edema, no wounds or ulcers Neuro is as above, nonfocal    Home meds:  - cymbalta 20 qd/ oxy IR prn/ ms contin 15 bid/ trazodone 50-75 mg hs prn  - losartan 50 qd  - prn's/ vitamins/ supplements   UA 6/28 - cloudy, 0-5 rbc/ wbc/ epi, 30 prot  Abd [place] 6.29 - 12.3/ 14cm kidneys w/o hydro, normal echo  CXR 6/28 - low lung volumes w/ vasc crowding, no edema   Baseline creat from May- June  2021 is 0.95- 1.15   Creat here was 2.51 on 6/28 > 3.6 > 4.7 yest and 5.46 today despite IVF's     BP' s here have been soft in the 90's and low 100's  Assessment/ Plan: 1. Acute renal failure - low urine volume in setting of hypotension and severe ascites suggest hepatorenal physiology similar to what is seen in advanced cirrhotics.  Other possibility is ATN due to TLS. Pt is not dehydrated on exam. UA and renal US are normal.  Very poor prognosis, very low UOP and rising creatinine, doubt that this will turn around. Not HD candidate due to comorbidities. Transition to hospice would probably be the best option. Have d/w primary MD.  2. Metastatic colon cancer - severe liver and peritoneal disease 3. AMS - lethargic prob more due to liver than renal but not sure      Kelly Splinter  MD 12/09/2019, 8:15 AM  Recent Labs  Lab 12/08/19 0500 12/09/19 0615  WBC 14.5* 19.6*  HGB 9.4* 9.0*    Recent Labs  Lab 12/08/19 1831 12/09/19 0615  K 5.3* 5.9*  BUN 51* 57*  CREATININE 5.04* 5.46*  CALCIUM 8.7* 8.7*  PHOS  --  7.7*

## 2019-12-09 NOTE — Progress Notes (Signed)
CSW received a call from the [place] [facility] that per pt's RN, pt was to go home at family's choice by POV, but family realized the difficulty involved after arriving at the hospital and then stated they would private pay for PTAR, but not tonight and then left the hospital.  CSW verified this with the RN at the request of the [place] and called pt's sister Magda Paganini at ph: [phone].  Pt's sister stated "Hospice said they couldn't be here tonight" so the pt couldn't transport to her home tonight and insisted CSW call Venia Carbon liason with [employer] and then call her back.  CSW called Venia Carbon at ph: [phone] and her VM stated she was not working today and to not leave a VM on her phone.  CSW called the on-call RN at [facility]) and was told that Freddie Breech was on call and would be giving the CSW a call back.  CSW then reviewed chart and saw a note from [facility] stating, "family not willing to take him tonight", as they, "are overwhelmed at his current condition and inability to participate in transferring".  Freddie Breech also stated in her note, "They state they cannot care for him like this tonight.  He will need PTAR to take him home in the morning.  Please call his sister in the AM on 12/10/19 once transport has been called so she can plan for his arrival.  [facility] liaison will f/u in the morning with [facility] manager.  CSW then updated the [place] who stated CSW is to let pt's RN know transport will take place on the morning of 12/10/19, and CSW then updated Freddie Breech who is on call with Hospice tonight and pt's RN.  CSW spoke to pt's sister who thanked the CSW  and is agreeable to bringing pt's D/C paperwork/DNR form back to the floor nurses on 6 EAST shortly tonight.  RN updated.  2nd shift ED CSW will leave handoff for 1st shift floor [facility] RN CM/CSW.  Please reconsult if future  social work needs arise.  CSW signing off, as social work intervention is no longer needed.  Alphonse Guild. Nonnie Pickney  MSW, LCSW, LCAS, CCS Transitions of Care Clinical Social Worker Care Coordination Department Ph: [phone]

## 2019-12-10 MED FILL — LORazepam 0.5 MG TABS: 0.5 | 10 days supply | Qty: 60 | Fill #0

## 2019-12-10 MED FILL — MORPHINE SULFATE (CONCENTRA: 20 | 10 days supply | Qty: 30 | Fill #0

## 2019-12-10 NOTE — Progress Notes (Signed)
PTAR is here to tansport patient home. Previous RN called and alerted the sister that the patient will be coming home today.

## 2019-12-11 LAB — CULTURE, BLOOD (ROUTINE X 2)
Culture: NO GROWTH
Culture: NO GROWTH
Special Requests: ADEQUATE
Special Requests: ADEQUATE

## 2020-01-09 DEATH — deceased

## 2021-11-27 IMAGING — CR DG ABDOMEN ACUTE W/ 1V CHEST
4 series · 4 of 4 positions shown · non-contrast
Comparison: None.

CLINICAL DATA: Abdominal pain, diarrhea, right lower rib pain

EXAM:
DG ABDOMEN ACUTE W/ 1V CHEST

[w chest pa]
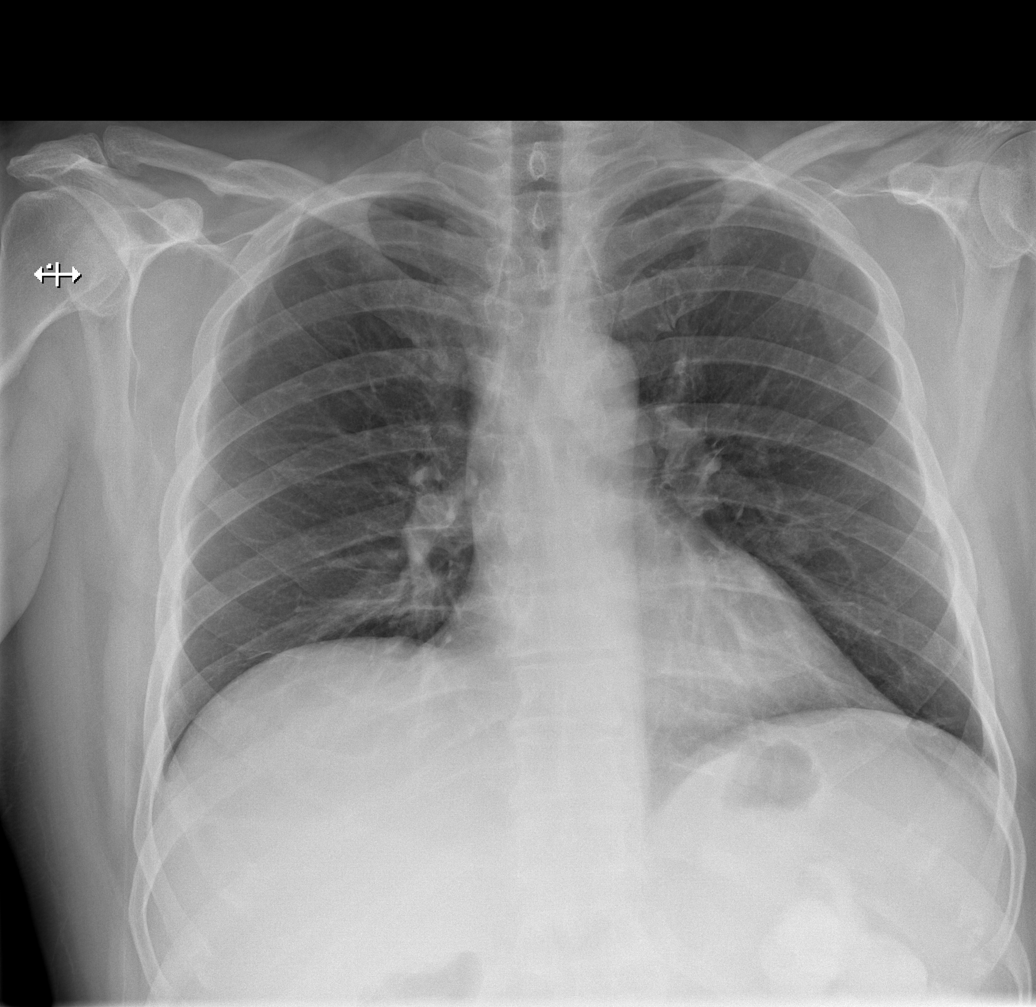

[w abdomen upright]
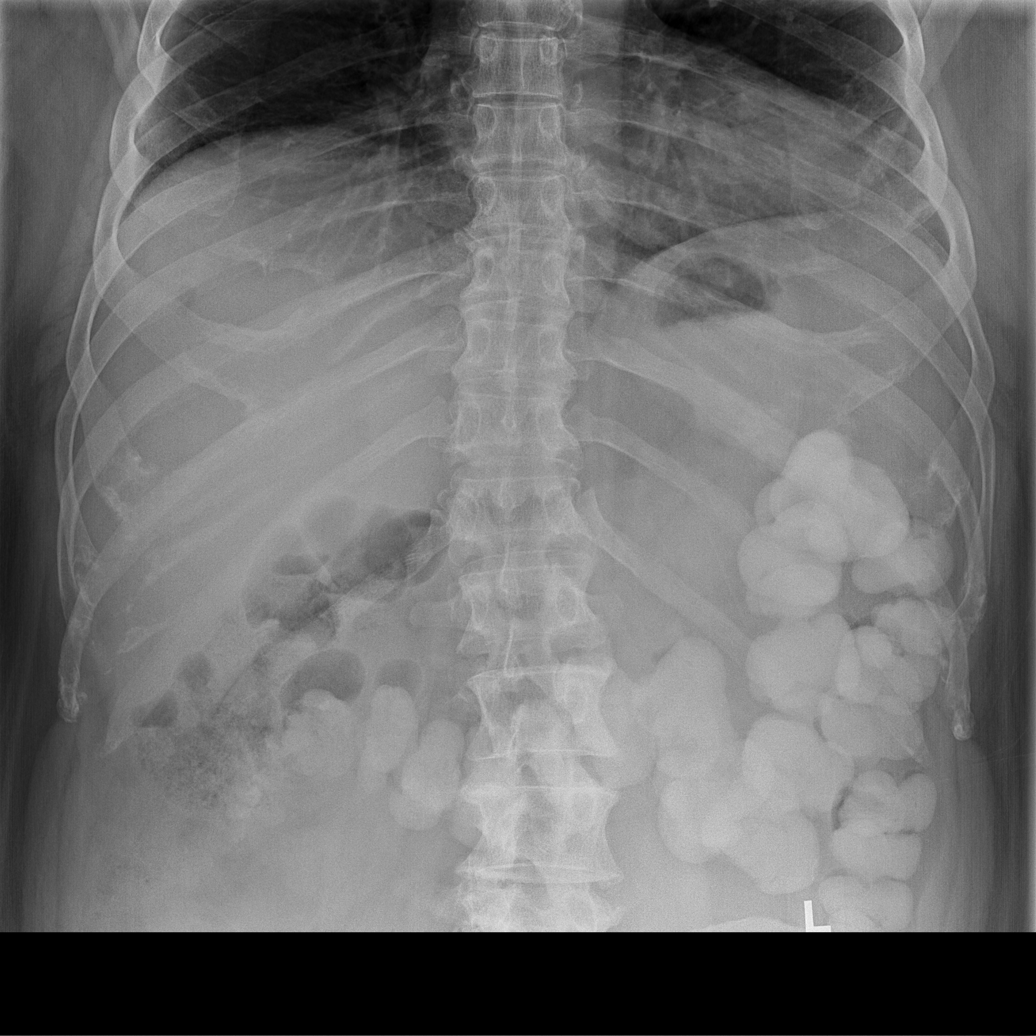

[t abdomen supine (1 of 2)]
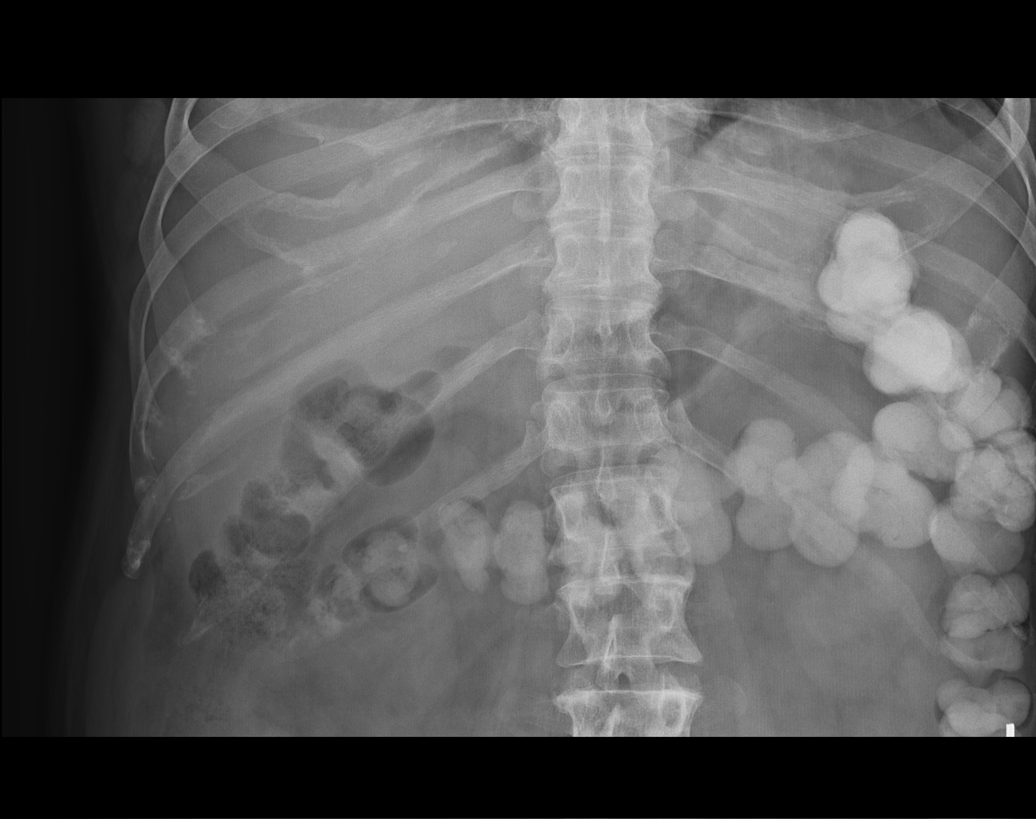

[t abdomen supine (2 of 2)]
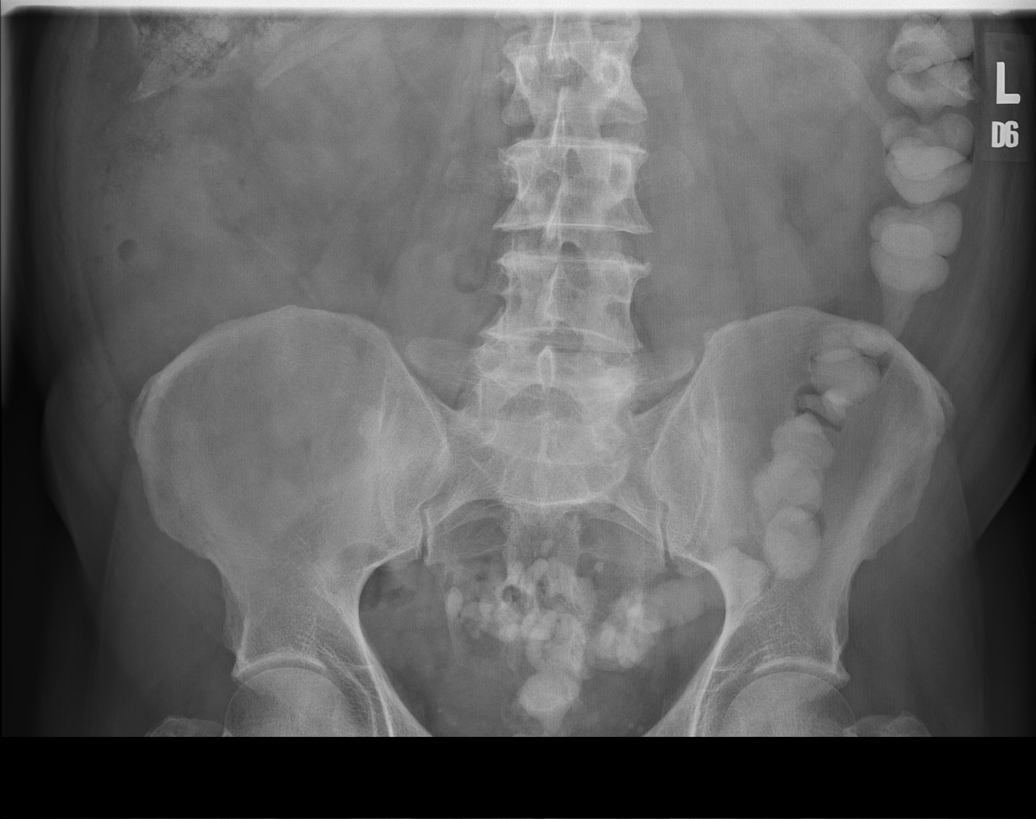

[4 of 4 positions shown; findings below may reference images not displayed]

FINDINGS: There is no evidence of dilated bowel loops or free intraperitoneal
air. Enteric contrast and stool balls in the distal colon and
rectum, in keeping with recent prior CT. No radiopaque calculi or
other significant radiographic abnormality is seen. Heart size and
mediastinal contours are within normal limits. Both lungs are clear.
IMPRESSION: 1.  No acute abnormality of the lungs.

2. Nonobstructive pattern of bowel gas. Enteric contrast and stool
balls in the distal colon and rectum, in keeping with recent prior
CT.

## 2021-12-06 IMAGING — US US BIOPSY CORE LIVER
1 series · 10 of 10 positions shown · non-contrast
Comparison: none

CLINICAL DATA: Colon mass.  Multiple liver lesions.

EXAM:
ULTRASOUND-GUIDED CORE LIVER BIOPSY
TECHNIQUE: An ultrasound guided liver biopsy was thoroughly discussed with the
patient and questions were answered. The benefits, risks,
alternatives, and complications were also discussed. The patient
understands and wishes to proceed with the procedure. A verbal as
well as written consent was obtained.

[Series 1: us core biopsy (liver) · 10 of 10 slices shown]
[im 1/10]
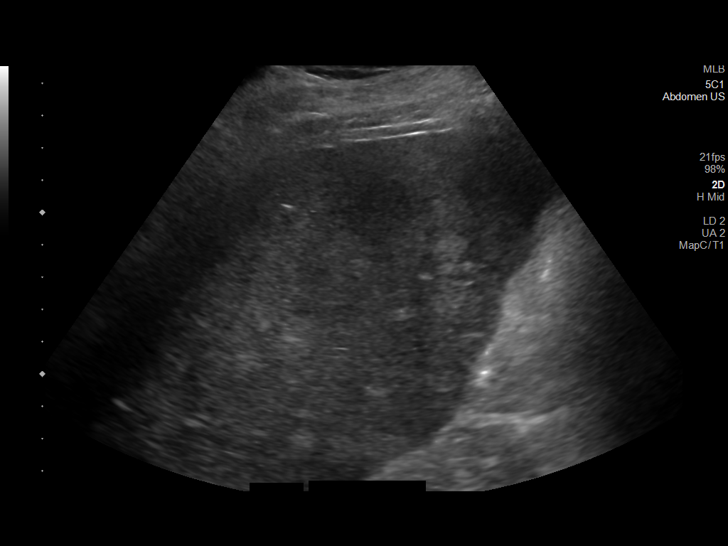
[im 2/10]
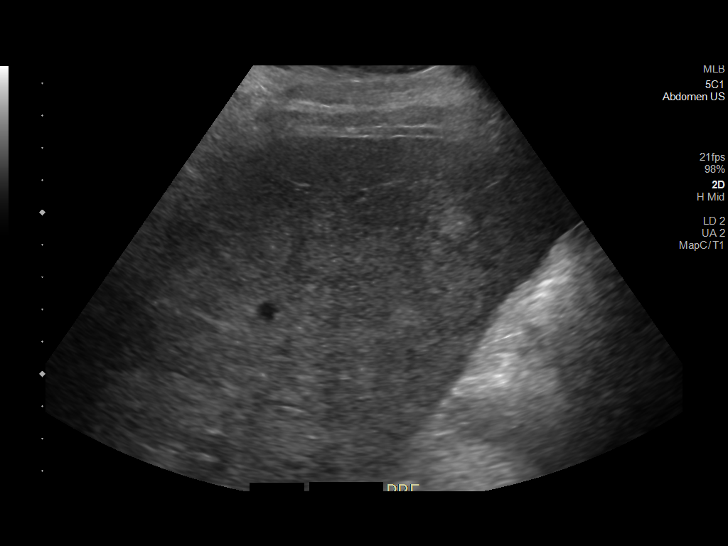
[im 3/10]
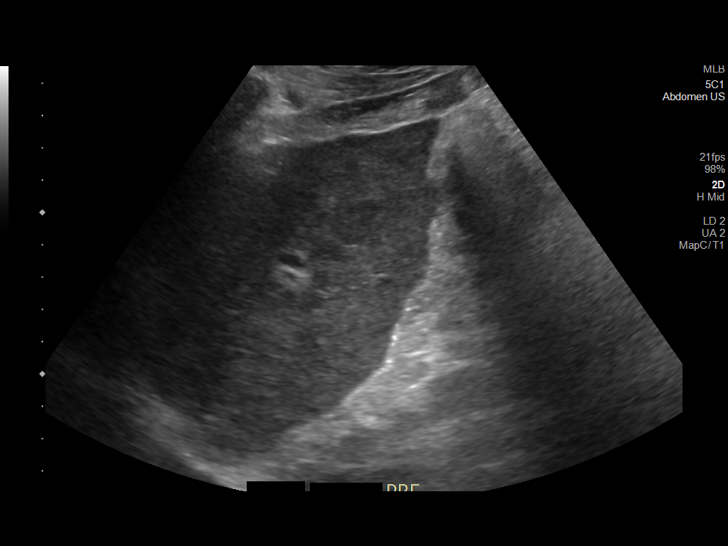
[im 4/10]
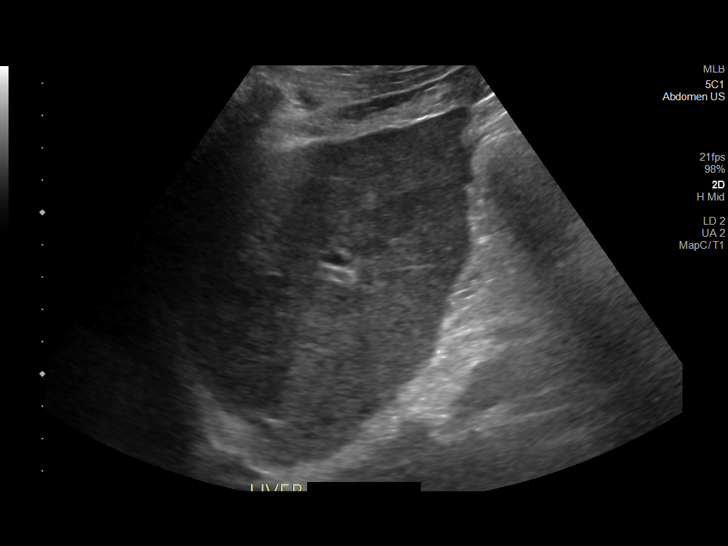
[im 5/10]
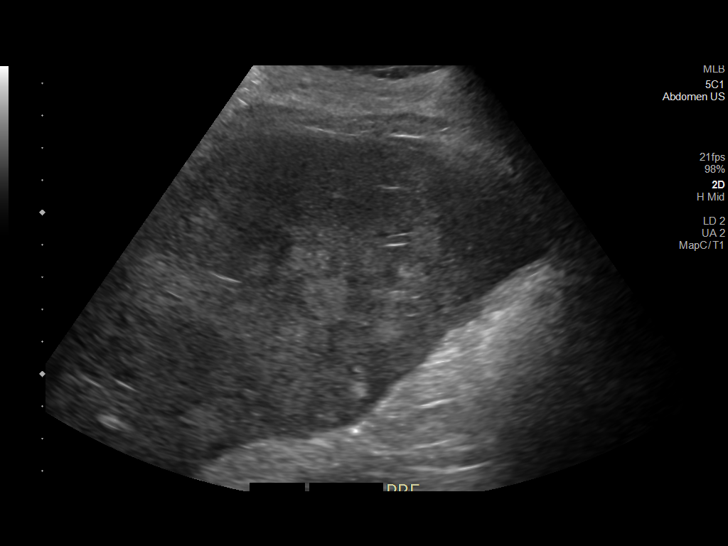
[im 6/10]
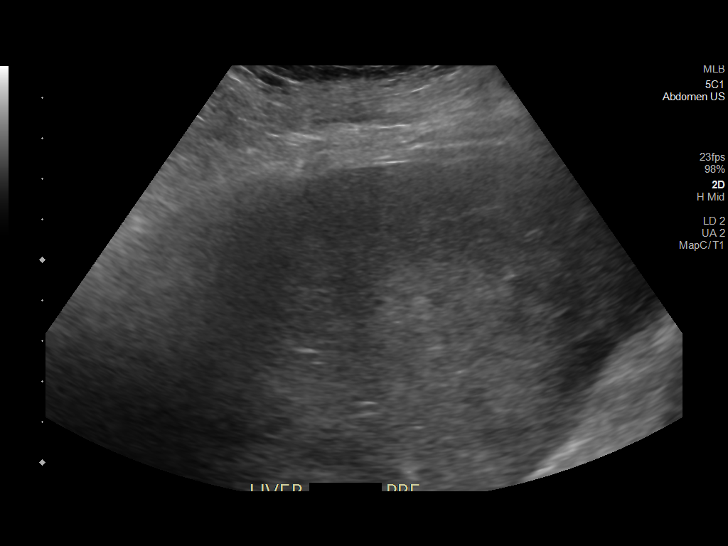
[im 7/10]
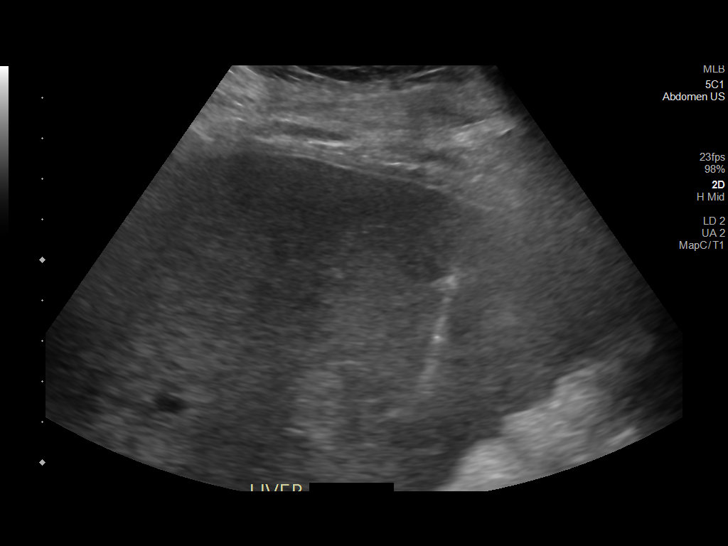
[im 8/10]
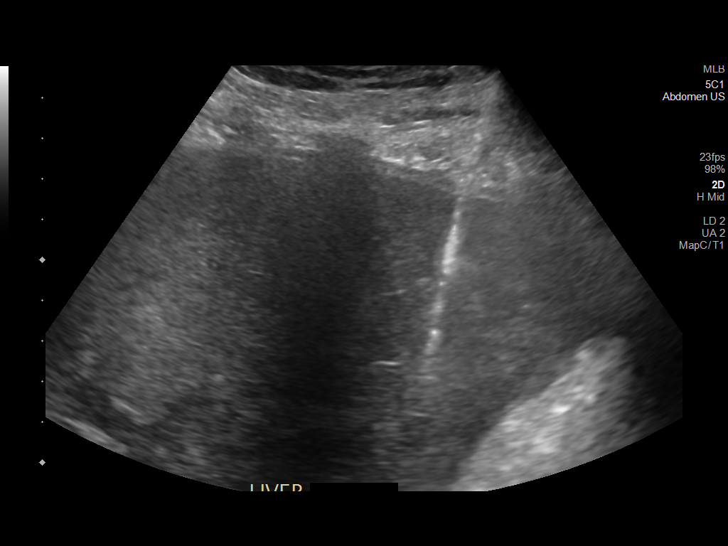
[im 9/10]
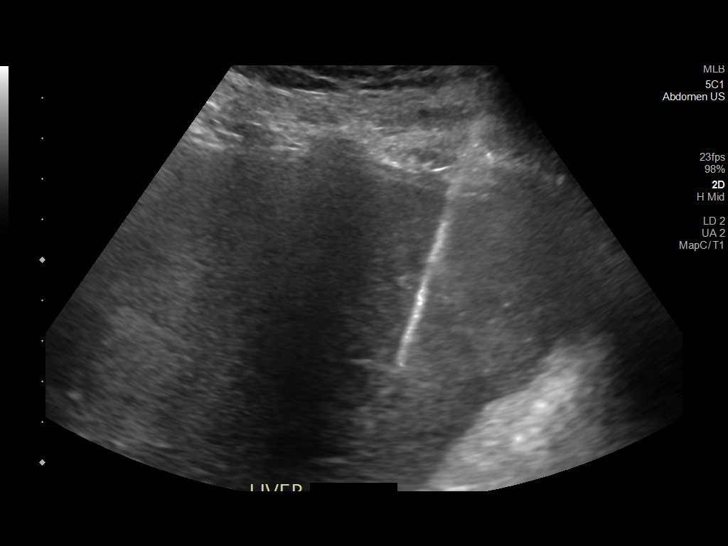
[im 10/10]
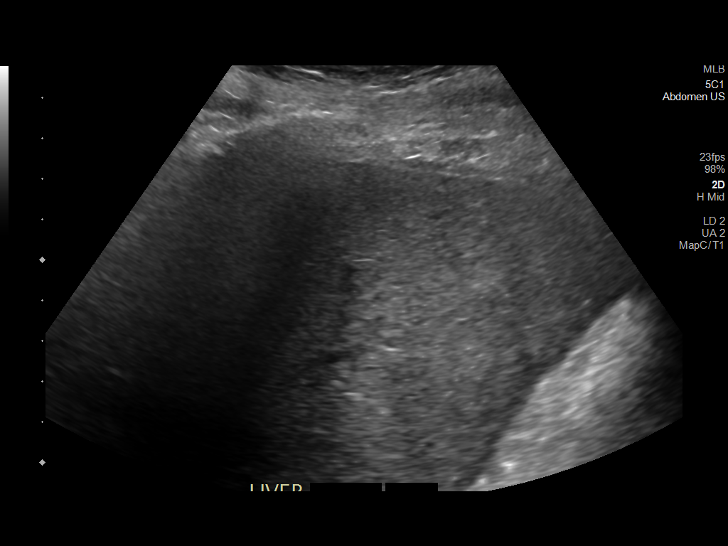

[10 of 10 positions shown; findings below may reference images not displayed]

Survey ultrasound of the liver was performed, representative lesion
localized, and an appropriate skin entry site was determined. Skin
site was marked, prepped with chlorhexidine, and draped in usual
sterile fashion, and infiltrated locally with 1% lidocaine.

Intravenous Fentanyl 95mcg and Versed 1mg were administered as
conscious sedation during continuous monitoring of the patient's
level of consciousness and physiological / cardiorespiratory status
by the radiology RN, with a total moderate sedation time of 20
minutes.

A 17 gauge trocar needle was advanced under ultrasound guidance into
the liver to the margin of the lesion. 3 coaxial 69gauge core
samples were then obtained through the guide needle. The guide
needle was removed. Post procedure scans demonstrate no apparent
complication.

COMPLICATIONS:
COMPLICATIONS
None immediate
FINDINGS: Ill-defined hepatic lesions were localized corresponding to CT
findings. Representative core biopsy samples obtained as above.
IMPRESSION: 1. Technically successful ultrasound guided core liver lesion
biopsy.
